# Patient Record
Sex: Female | Born: 1955
Health system: Western US, Academic
[De-identification: ages and names within clinical notes are randomized; demographics above are authoritative.]

---

## 2009-03-04 IMAGING — CR DG ELBOW COMPLETE 3+V*L*
4 series · 4 of 4 positions shown · non-contrast
Comparison: None

CLINICAL DATA: Posterior lateral elbow pain and swelling after
falling 1 week ago.

LEFT ELBOW - COMPLETE 3+ VIEW

[x elbow joint ap left]
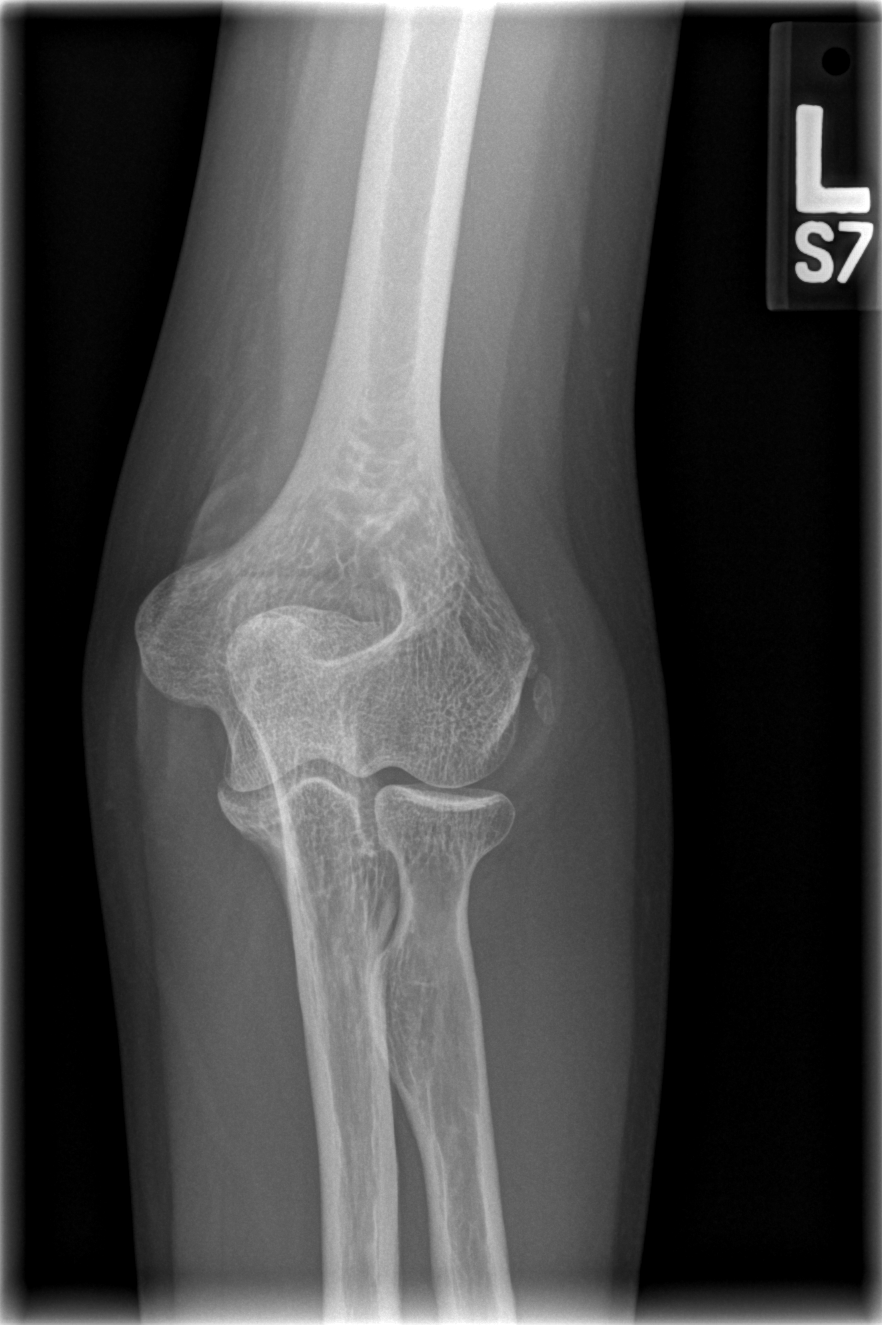

[x elbow joint obl. left (1 of 2)]
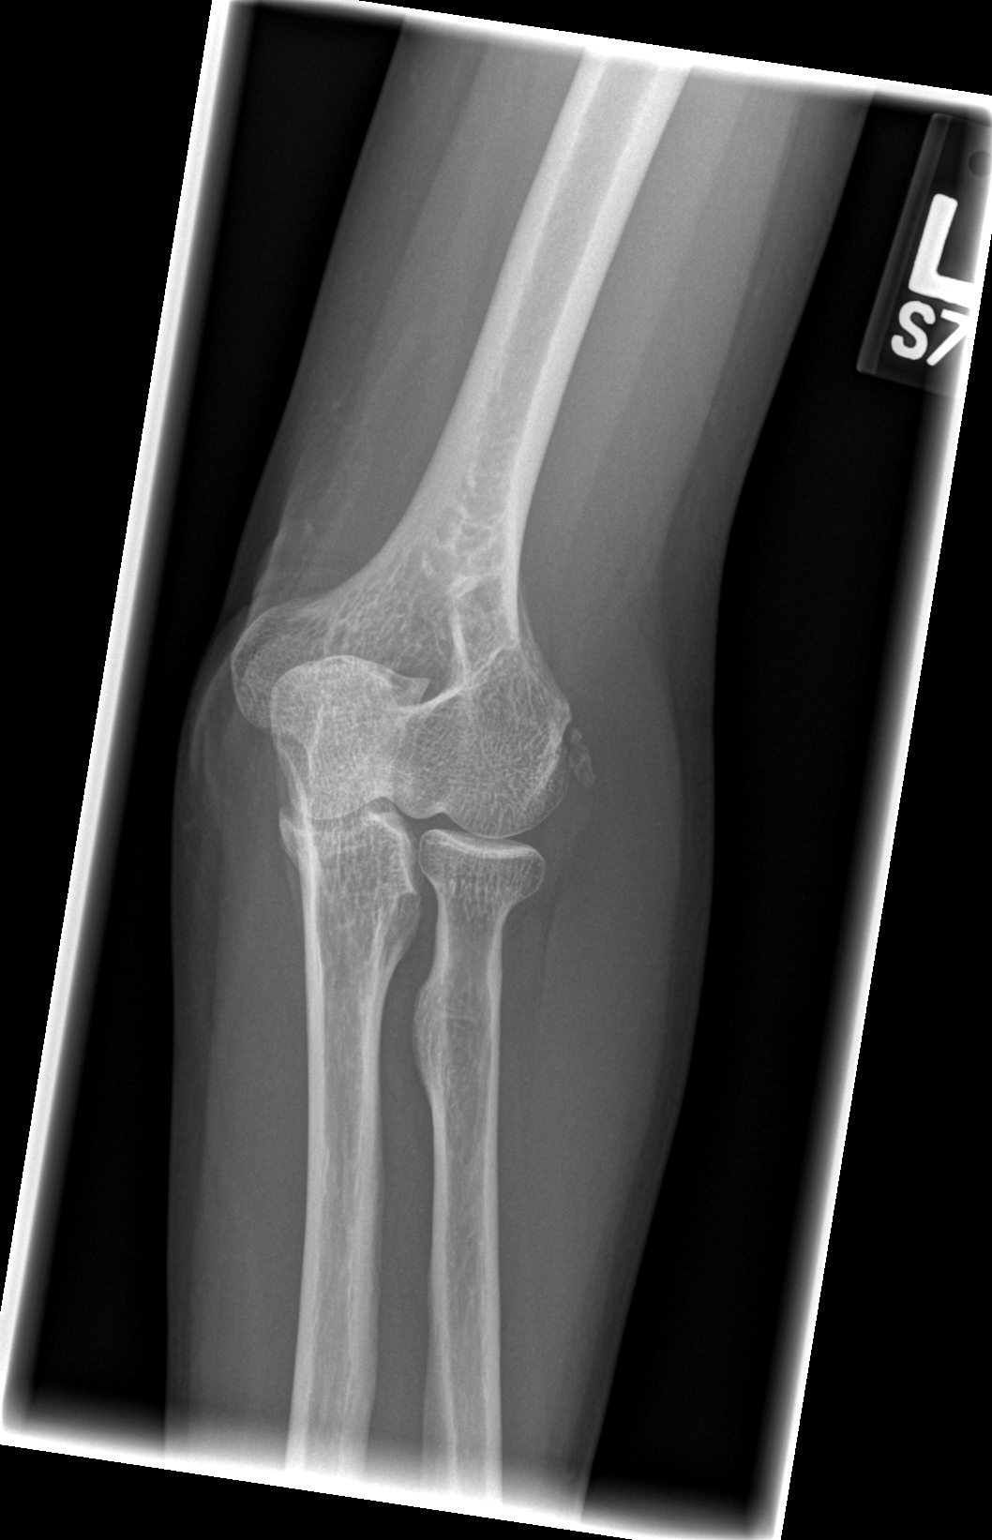

[x elbow joint obl. left (2 of 2)]
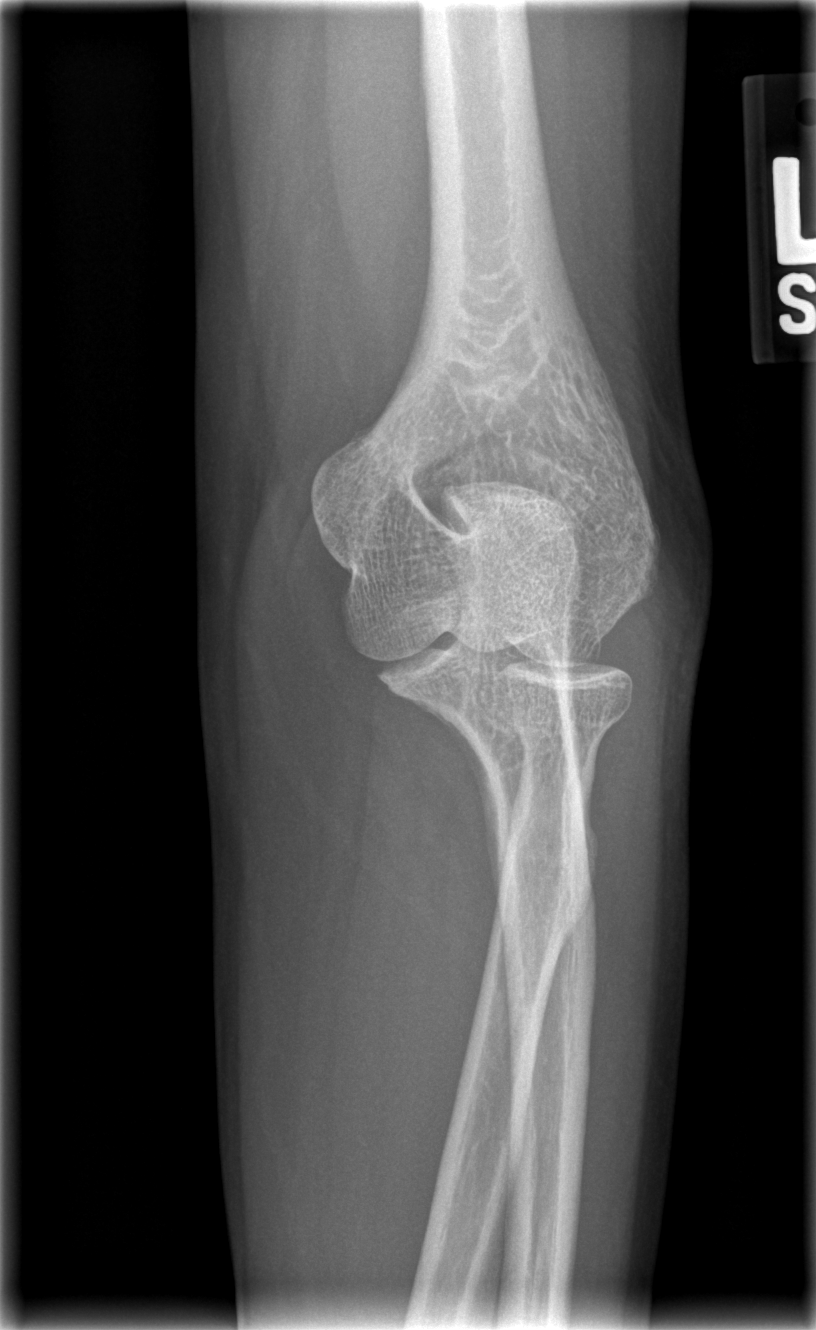

[x elbow joint lat left]
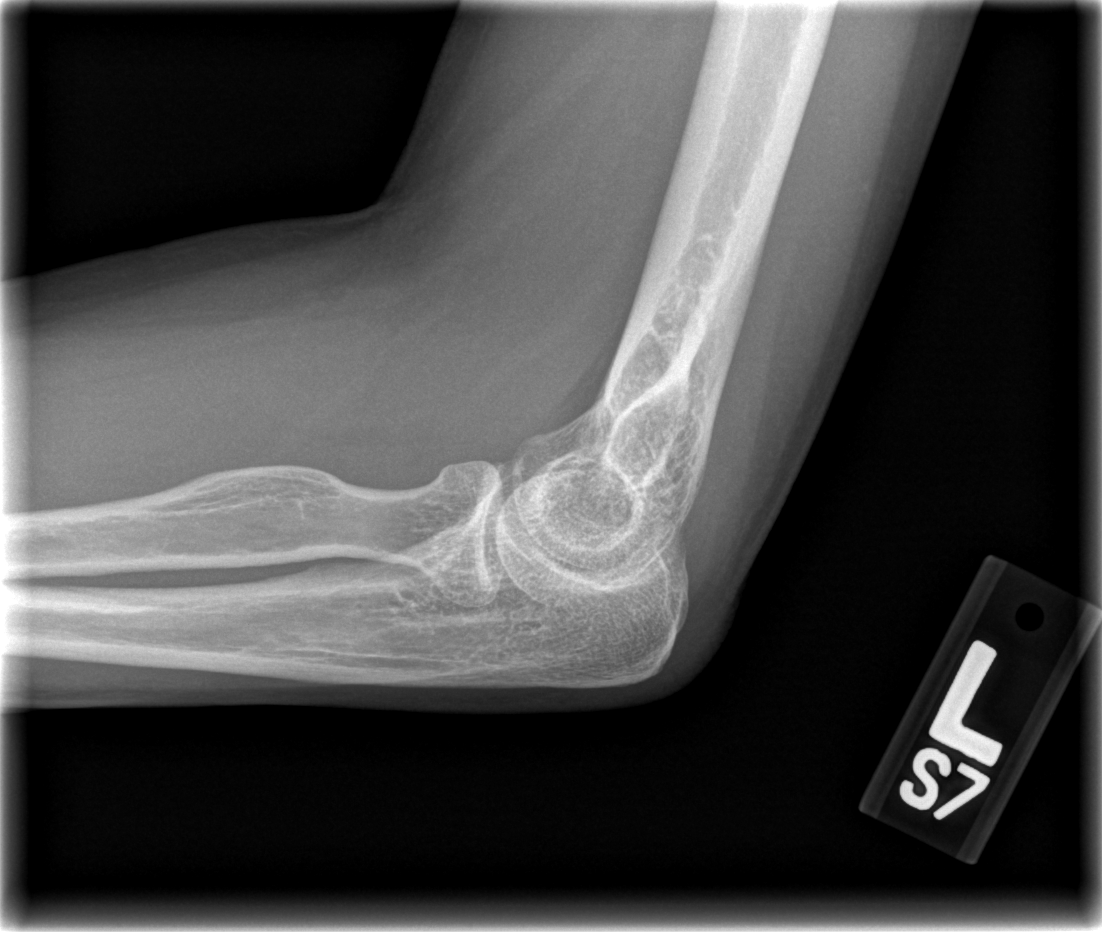

[4 of 4 positions shown; findings below may reference images not displayed]

FINDINGS: There is no significant elbow joint effusion.  Well
corticated ossific densities are noted adjacent to the lateral
humeral epicondyle.  These have an appearance most compatible with
chronic enthesopathic change or an old injury at the common
extensor tendon origin.  No acute avulsion fracture is identified.
The radial head is normally located.
IMPRESSION: 1.  No demonstrated acute osseous findings.  No evidence of elbow
joint effusion.
2.  Ossific density adjacent to the lateral humeral epicondyle
appears nonacute, but may be related to chronic lateral
epicondylitis or an old avulsion injury.  Correlate clinically.

## 2016-09-26 ENCOUNTER — Ambulatory Visit: Payer: MEDICARE

## 2016-09-26 DIAGNOSIS — R079 Chest pain, unspecified: Secondary | ICD-10-CM

## 2016-09-26 DIAGNOSIS — R51 Headache: Secondary | ICD-10-CM

## 2016-09-26 DIAGNOSIS — I10 Essential (primary) hypertension: Secondary | ICD-10-CM

## 2016-09-27 ENCOUNTER — Inpatient Hospital Stay
Admit: 2016-09-27 | Discharge: 2016-09-27 | Disposition: A | Discharge status: Other Acute Inpatient Hospital | Payer: MEDICARE | Source: Home / Self Care

## 2016-12-27 DIAGNOSIS — K130 Diseases of lips: Secondary | ICD-10-CM

## 2016-12-28 ENCOUNTER — Inpatient Hospital Stay
Admit: 2016-12-28 | Discharge: 2016-12-28 | Disposition: A | Discharge status: Home / Self Care | Payer: PRIVATE HEALTH INSURANCE | Source: Home / Self Care

## 2016-12-28 MED ORDER — CLINDAMYCIN HCL 300 MG PO CAPS
300 mg | ORAL_CAPSULE | Freq: Three times a day (TID) | ORAL | 0 refills | Status: AC
Start: 2016-12-28 — End: ?

## 2018-03-05 ENCOUNTER — Ambulatory Visit

## 2018-03-05 ENCOUNTER — Inpatient Hospital Stay
Admit: 2018-03-05 | Discharge: 2018-03-05 | Disposition: A | Discharge status: Home / Self Care | Source: Home / Self Care

## 2018-03-05 DIAGNOSIS — M25552 Pain in left hip: Secondary | ICD-10-CM

## 2018-03-05 MED ORDER — CYCLOBENZAPRINE HCL 10 MG PO TABS
10 mg | ORAL_TABLET | Freq: Two times a day (BID) | ORAL | 0 refills | Status: AC | PRN
Start: 2018-03-05 — End: ?

## 2018-03-05 NOTE — ED Notes
Patient arrived from home c/o gradual onset of worsening L-hip pain s/p ground level fall twice within the last 3 weeks. Patient states the initial fall was a mechanical trip & fall & states landed on buttocks.   Patient denies head injury/LOC.   No obvious signs of deformity noted.   Patient denies numbness/tingling to LLE.   Patient denies loss of bladder/bowel control.   Patient states was seen by PCP last Thursday & given Rx for Ibuprofen, however, reports has not been effective.

## 2018-03-05 NOTE — ED Notes
After care instructions discussed at bedside.   Advised patient to follow up w/ PCP w/in next 2 days.   Discussed w/ pt. S&S to observe for & when to seek emergent care.   Patient verbalized understanding of DC instructions, however, reports is unhappy w/ MD care.   Patient ambulatory w/ a steady gait w/ proper use of crutches noted.

## 2018-03-05 NOTE — ED Provider Notes
[facility]  Emergency Department Service Report    Karen Porter 62 y.o. female , presents with Hip Pain      Triage   Arrived on 03/05/2018 at 1:38 PM   Arrived by Walk-in [14]    ED Triage Vitals   Temp Temp Source BP Heart Rate Resp SpO2 O2 Device Pain Score Weight   03/05/18 1344 03/05/18 1344 03/05/18 1344 03/05/18 1344 03/05/18 1344 03/05/18 1344 03/05/18 1344 03/05/18 1357 03/05/18 1357   36.6 ???C (97.8 ???F) Oral 142/67 98 18 98 % None (Room air) Ten 52.2 kg (115 lb)       Pre hospital care:       Allergies   Allergen Reactions   ??? Penicillins Rash       History   Patient is a 62 y.o. female with hx of HTN who presents to the ED with complaint of gradual onset left hip pain that started about 3 weeks ago after falling twice. Sx are mild, constant, unchanging, non-radiating, no alleviating factors, worse with movement. Denies focal weakness or numbness. Denies recent LOC.           The history is provided by the patient. No language interpreter was used.   Hip Pain   This is a new problem. The current episode started 1 to 4 weeks ago. The problem occurs constantly. The problem has been unchanged. Associated symptoms include arthralgias. Pertinent negatives include no numbness or weakness. The symptoms are aggravated by walking. She has tried nothing for the symptoms. The treatment provided no relief.            Past Medical History:   Diagnosis Date   ??? Amblyopia of right eye    ??? Anxiety    ??? Depression    ??? Hypertension     pt states she stopped taking bp medication 11/25 because it was under conrol with diet   ??? Insomnia    ??? Retinal detachment, right         Past Surgical History:   Procedure Laterality Date   ??? BUNIONECTOMY     ??? CATARACT EXTRACTION  1986    right eye   ??? OVARIAN CYST REMOVAL          Past Family History   Family history reviewed by me and there is no pertinent past family history related to patient's current case and/or care.      Past Social History she reports that she has quit smoking. She smoked 0.25 packs per day. She has never used smokeless tobacco. She reports that she does not drink alcohol or use drugs. Her sexual activity history is not on file.     Review of Systems   Musculoskeletal: Positive for arthralgias.   Neurological: Negative for weakness and numbness.   All other systems reviewed and are negative.      Physical Exam   Physical Exam   Constitutional: She appears well-developed and well-nourished. No distress.   HENT:   Head: Normocephalic and atraumatic.   Eyes: Conjunctivae and EOM are normal.   Neck: Normal range of motion. Neck supple.   Cardiovascular: Normal rate and regular rhythm.    Pulmonary/Chest: Effort normal and breath sounds normal. No respiratory distress.   Abdominal: Soft. There is no tenderness. There is no rebound and no guarding.   Musculoskeletal: Normal range of motion.   Mild tenderness to left hip.    Lymphadenopathy:     She has no cervical adenopathy.  Neurological: She is alert. No sensory deficit.   Moving all four extremities.   Skin: Skin is warm and dry.   Psychiatric: Her behavior is normal.   Nursing note and vitals reviewed.      ED Course          Laboratory Results   Labs Reviewed - No data to display    Imaging Results     No orders to display       Administered Medications     Medication Administration from 03/05/2018 1339 to 03/08/2018 1115     None          Procedures   Procedures    MDM  ED Course:  Nursing note reviewed.  Previous medical records were obtained and reviewed by myself. They reveal a history of HTN amongst others.      1401: I visited the patient to obtain history and perform the physical exam.  Orders placed for XR hip ap+lat left with pelvis (3 views), and XR lumbar spine ap+lat (2 views).      Radiology Results (as interpreted by me):   XR hip ap+lat left with pelvis (3 views):   No acute fracture or dislocation.     XR lumbar spine ap+lat (2 views): No acute fracture or dislocation noted.     1415: On reassessment, the patient remains grossly hemodynamically stable.    Medical Decision Making:  Karen Porter presents to the ED today with chief complaint of left hip pain.  On exam, the patient exhibits mild left hip tenderness.  Differential diagnosis includes acute left hip pain, hip fracture, hip dislocation, amongst others.  Patient with acute left hip pain post fall; xrays ordered and reviewed and show no e/o acute fracture or dislocation; Patient is otherwise well appearing; feel it is reasonable to discharge the patient home at this time with close outpatient follow up given and strict return precautions provided.     Given the patient's physical exam,  and imaging studies, my impression is the patient is experiencing an episode of acute left hip pain. Therefore, I feel comfortable discharging the patient home with close outpatient follow up.    Discharge Instructions:  I advised the patient to follow up with their primary care physician on an outpatient basis over the next 24-48 hours, and recommended prompt  return to the emergency department if they have additional concerns or note worsening symptoms.  The patient indicates understanding of these issues and agrees with the plan.  A copy of the ED  workup results was provided to the patient as well.      Clinical Impression     1. Acute hip pain, left        Prescriptions     Discharge Medication List as of 03/05/2018  2:42 PM      START taking these medications    Details   cyclobenzaprine 10 mg tablet Take 1 tablet (10 mg total) by mouth two (2) times daily as needed for Muscle spasms., Starting Wed 03/05/2018, Print             Disposition and Follow-up   Disposition: Discharge [1]    No future appointments.    Follow up with:  Carroll Kinds, MD  [address]  [address]  [place] [address]  [phone]    In 2 days        Return precautions are specified on After Visit Summary. The documentation on this chart was performed  by Debarah Crape, scribed for Dewayne Shorter., MD    03/05/2018 2:53 PM     All scribe entries and documentation made by the scribe were entered at my direction.  I have reviewed this medical record and agree to the accuracy and completeness of the content entered by the scribe.  The documentation recorded by the scribe accurately reflects the service I personally performed and the decisions made by me.    Karen Porter 11:15 AM                   Dewayne Shorter., MD  03/08/18 1115

## 2019-01-22 ENCOUNTER — Ambulatory Visit: Payer: MEDICARE

## 2019-01-22 ENCOUNTER — Inpatient Hospital Stay
Admit: 2019-01-22 | Discharge: 2019-01-22 | Disposition: A | Discharge status: Home / Self Care | Payer: PRIVATE HEALTH INSURANCE | Source: Home / Self Care

## 2019-01-22 DIAGNOSIS — R079 Chest pain, unspecified: Secondary | ICD-10-CM

## 2019-01-22 DIAGNOSIS — F3170 Bipolar disorder, currently in remission, most recent episode unspecified: Secondary | ICD-10-CM

## 2019-01-22 DIAGNOSIS — H8110 Benign paroxysmal vertigo, unspecified ear: Secondary | ICD-10-CM

## 2019-01-22 DIAGNOSIS — F1911 Other psychoactive substance abuse, in remission: Secondary | ICD-10-CM

## 2019-01-22 LAB — Extra Lavender Top

## 2019-01-22 LAB — Differential Automated: ABSOLUTE LYMPHOCYTE COUNT: 1.18 10*3/uL — ABNORMAL LOW (ref 1.30–3.40)

## 2019-01-22 LAB — Basic Metabolic Panel: TOTAL CO2: 25 mmol/L (ref 20–30)

## 2019-01-22 LAB — UA,Dipstick: NITRITE: NEGATIVE (ref 5.0–8.0)

## 2019-01-22 LAB — CBC: MEAN CORPUSCULAR HEMOGLOBIN: 27.1 pg (ref 26.4–33.4)

## 2019-01-22 LAB — CK, Total: CREATINE PHOSPHOKINASE, TOTAL: 75 U/L (ref 38–282)

## 2019-01-22 LAB — UA,Microscopic: WBCS HPF: 1 {cells}/[HPF] (ref 0–4)

## 2019-01-22 LAB — Lipase: LIPASE: 30 U/L (ref 9–63)

## 2019-01-22 LAB — Hepatic Funct Panel: ASPARTATE AMINOTRANSFERASE: 11 U/L — ABNORMAL LOW (ref 13–47)

## 2019-01-22 LAB — Prothrombin Time Panel: INR: 1 s (ref 11.5–14.4)

## 2019-01-22 LAB — APTT: APTT: 32.1 s (ref 24.4–36.2)

## 2019-01-22 LAB — Troponin I: TROPONIN INTERPRETATION: NEGATIVE ng/mL (ref <0.1)

## 2019-01-22 LAB — Extra Gold Top

## 2019-01-22 MED ORDER — MECLIZINE HCL 25 MG PO TABS
25 mg | ORAL_TABLET | Freq: Three times a day (TID) | ORAL | 0 refills | Status: AC | PRN
Start: 2019-01-22 — End: ?

## 2019-01-22 MED ADMIN — ONDANSETRON HCL 4 MG/2ML IJ SOLN: 4 mg | INTRAVENOUS | @ 18:00:00 | Stop: 2019-01-22 | NDC 60505613005

## 2019-01-22 MED ADMIN — SODIUM CHLORIDE 0.9 % IV BOLUS: 500 mL | INTRAVENOUS | @ 18:00:00 | Stop: 2019-01-22 | NDC 00338954304

## 2019-01-22 MED ADMIN — MECLIZINE HCL 25 MG PO TABS: 50 mg | ORAL | @ 20:00:00 | Stop: 2019-01-22 | NDC 50268052315

## 2019-01-22 MED ADMIN — IOHEXOL 350 MG/ML IV SOLN: 150 mL | INTRAVENOUS | @ 19:00:00 | Stop: 2019-01-22 | NDC 00407141493

## 2019-01-22 MED ADMIN — HYDROCODONE-ACETAMINOPHEN 5-325 MG PO TABS: 1 | ORAL | @ 22:00:00 | Stop: 2019-01-22 | NDC 00406012362

## 2019-01-22 NOTE — Consults
PATIENTMickayla Porter  MRN:  [id number]  DOB:  10/04/1955  DATE OF SERVICE:  01/22/2019    REQUESTING PHYSICIAN: No ref. provider found  PRIMARY CARE PHYSICIAN: Carroll Kinds, MD    History of Present Illness:    Karen Porter is a right-handed 63 y.o. female who  has a past medical history of Amblyopia of right eye, Anxiety, Depression, Hypertension, Insomnia, and Retinal detachment, right. who was admitted for dizziness, consulted by neurology in setting of code stroke.     Patient an otherwise fairly health female presenting with dizziness. Reportedly listening to music, sitting, noticed acute onset of dizziness with associated HA (which happens from time to time). Attempted to stand, dizziness continued but did not worsen. Described this as a 'buzzing sensation', not necessarily vertigo, but not able to describe further. Patient reports that she may have had mild 'fuzziness to my vision', but no overt change. Is blind in R eye. No weakness, los of sensation or strength, slurred speech, CP, SOB, fevers. No HX of stroke in herself or family.     Only takes a medication for HTN and vitamin supplements. Denies toxic habits although has been admitted for polysubstance abuse in the past and has been to rehab.       Past Medical History:   Diagnosis Date   ??? Amblyopia of right eye    ??? Anxiety    ??? Depression    ??? Hypertension     pt states she stopped taking bp medication 11/25 because it was under conrol with diet   ??? Insomnia    ??? Retinal detachment, right        Past Surgical History:   Procedure Laterality Date   ??? BUNIONECTOMY     ??? CATARACT EXTRACTION  1986    right eye   ??? OVARIAN CYST REMOVAL         Allergy:  Allergies   Allergen Reactions   ??? Penicillins Rash       Medications:  Prior to Admission medications    Medication Sig Start Date End Date Taking? Authorizing Provider   BusPIRone HCl (BUSPAR PO) Take 10 mg by mouth three (3) times daily .    [provider] cyclobenzaprine 10 mg tablet Take 1 tablet (10 mg total) by mouth two (2) times daily as needed for Muscle spasms. 03/05/18   Pablo Ledger, MD   Divalproex Sodium (DEPAKOTE PO) Take 1,000 mg by mouth daily.    [provider]   lisinopril 5 mg tablet Take 5 mg by mouth daily.    [provider]   olanzapine 10 mg tablet Take 5 mg by mouth at bedtime as needed.    [provider]       Family History:  No family history on file.    Social History:  Social History     Socioeconomic History   ??? Marital status: Divorced     Spouse name: Not on file   ??? Number of children: Not on file   ??? Years of education: Not on file   ??? Highest education level: Not on file   Occupational History     Employer: NA   Social Needs   ??? Financial resource strain: Not on file   ??? Food insecurity:     Worry: Not on file     Inability: Not on file   ??? Transportation needs:     Medical: Not on file     Non-medical: Not  on file   Tobacco Use   ??? Smoking status: Former Smoker     Packs/day: 0.25   ??? Smokeless tobacco: Never Used   ??? Tobacco comment: 1-3 cigs a day   Substance and Sexual Activity   ??? Alcohol use: No   ??? Drug use: No   ??? Sexual activity: Not on file     Comment: last used last year.    Lifestyle   ??? Physical activity:     Days per week: Not on file     Minutes per session: Not on file   ??? Stress: Not on file   Relationships   ??? Social connections:     Talks on phone: Not on file     Gets together: Not on file     Attends religious service: Not on file     Active member of club or organization: Not on file     Attends meetings of clubs or organizations: Not on file     Relationship status: Not on file   Other Topics Concern   ??? 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 Vitals signs for last 24 hours: Temp:  [36.6 ???C (97.9 ???F)] 36.6 ???C (97.9 ???F)  Heart Rate:  [91-96] 92  Resp:  [11-18] 11  BP: (147-159)/(78-88) 157/85  SpO2:  [97 %-99 %] 99 %   General: well developed, well nourished. alert, appears stated age and cooperative  Extremities: No clubbing, cyanosis or edema.  Pulses are intact in the extremities. There is no swelling of the vessels.    Neurologic exam:   Mental status: Attention and concentration are normal.  Alert and oriented to person, place and time.  Speech is spontaneous and fluent with naming, repetition and comprehension intact. Fund of knowledge is intact.   Cranial nerves: Visual fields are full.  Fundi are normal with no edema of optic discs.  Pupils are equal, round and reactive to light.  Extraocular movements intact.  Face intact to light touch and pinprick.  Face is symmetric. Hearing intact to finger rub. Palate elevates equally.  Sternocleidomastoid 5/5.  Tongue midline.  Motor: Normal bulk and tone.  Strength is 5/5 throughout.   Sensory: Intact to light touch, vibration, proprioception, pain.  Reflexes: 2+ and symmetric  Coordination: Intact to fine finger movements.   Gait: Deferred    Lab Review:      Recent labs:   Recent Results (from the past 72 hour(s))   Basic Metabolic Panel    Collection Time: 01/22/19 10:57 AM   Result Value Ref Range    Sodium 140 135 - 146 mmol/L    Potassium 3.5 (L) 3.6 - 5.3 mmol/L    Chloride 101 96 - 106 mmol/L    Total CO2 25 20 - 30 mmol/L    Anion Gap 14 8 - 19 mmol/L    Glucose 147 (H) 65 - 99 mg/dL    GFR Estimate for Non-African American 85 See GFR Additional Information mL/min/1.50m2    GFR Estimate for African American >89 See GFR Additional Information mL/min/1.73m2    GFR Additional Information See Comment     Creatinine 0.75 0.60 - 1.30 mg/dL    Urea Nitrogen 13 7 - 22 mg/dL    Calcium 9.2 8.6 - 95.6 mg/dL  Troponin I    Collection Time: 01/22/19 10:57 AM   Result Value Ref Range Troponin I <0.04 <0.1 ng/mL    Troponin Interpretation Negative Negative   CK, Total    Collection Time: 01/22/19 10:57 AM   Result Value Ref Range    Creatine Phosphokinase, Total 75 38 - 282 U/L   Prothrombin Time Panel    Collection Time: 01/22/19 10:57 AM   Result Value Ref Range    Prothrombin Time 12.5 11.5 - 14.4 seconds    INR 1.0 .   APTT    Collection Time: 01/22/19 10:57 AM   Result Value Ref Range    APTT 32.1 24.4 - 36.2 seconds   CBC    Collection Time: 01/22/19 10:57 AM   Result Value Ref Range    White Blood Cell Count 7.03 4.16 - 9.95 x10E3/uL    Red Blood Cell Count 4.28 3.96 - 5.09 x10E6/uL    Hemoglobin 11.6 11.6 - 15.2 g/dL    Hematocrit 08.6 57.8 - 45.2 %    Mean Corpuscular Volume 84.1 79.3 - 98.6 fL    Mean Corpuscular Hemoglobin 27.1 26.4 - 33.4 pg    MCH Concentration 32.2 31.5 - 35.5 g/dL    Red Cell Distribution Width-SD 42.0 36.9 - 48.3 fL    Red Cell Distribution Width-CV 13.7 11.1 - 15.5 %    Platelet Count, Auto 385 143 - 398 x10E3/uL    Mean Platelet Volume 8.7 (L) 9.3 - 13.0 fL    Nucleated RBC%, automated 0.0 No Ref. Range %    Absolute Nucleated RBC Count 0.00 0.00 - 0.00 x10E3/uL    Neutrophil Abs (Prelim) 5.18 See Absolute Neut Ct. x10E3/uL   Differential, Automated    Collection Time: 01/22/19 10:57 AM   Result Value Ref Range    Neutrophil Percent, Auto 73.6 No Ref. Range %    Lymphocyte Percent, Auto 16.8 No Ref. Range %    Monocyte Percent, Auto 5.7 No Ref. Range %    Eosinophil Percent, Auto 2.7 No Ref. Range %    Basophil Percent, Auto 0.6 No Ref. Range %    Immature Granulocytes% 0.6 No Reference Range %    Absolute Neut Count 5.18 1.80 - 6.90 x10E3/uL    Absolute Lymphocyte Count 1.18 (L) 1.30 - 3.40 x10E3/uL    Absolute Mono Count 0.40 0.20 - 0.80 x10E3/uL    Absolute Eos Count 0.19 0.00 - 0.50 x10E3/uL    Absolute Baso Count 0.04 0.00 - 0.10 x10E3/uL    Absolute Immature Gran Count 0.04 0.00 - 0.04 x10E3/uL   Extra Lavender Top    Collection Time: 01/22/19 10:57 AM Result Value Ref Range    Extra Tube Performed    Extra Gold Top    Collection Time: 01/22/19 10:57 AM   Result Value Ref Range    Extra Tube Performed    Lipase    Collection Time: 01/22/19 10:57 AM   Result Value Ref Range    Lipase 30 9 - 63 U/L   Hepatic Funct Panel    Collection Time: 01/22/19 10:57 AM   Result Value Ref Range    Total Protein 6.9 6.1 - 8.2 g/dL    Albumin 3.9 3.9 - 5.0 g/dL    Bilirubin,Total 0.3 0.1 - 1.2 mg/dL    Bilirubin,Conjugated <0.2 <=0.3 mg/dL    Alkaline Phosphatase 90 37 - 113 U/L    Aspartate Aminotransferase 11 (L) 13 - 47 U/L    Alanine Aminotransferase 8 8 -  64 U/L   UA,Dipstick    Collection Time: 01/22/19 11:51 AM   Result Value Ref Range    Urine Color Colorless      Specific Gravity 1.040 (H) 1.005 - 1.030    pH,Urine 7.0 5.0 - 8.0    Blood Negative Negative    Bilirubin Negative Negative    Ketones Negative Negative    Glucose Negative Negative    Protein Negative Negative    Leukocyte Esterase Negative Negative    Nitrite Negative Negative   UA,Microscopic    Collection Time: 01/22/19 11:51 AM   Result Value Ref Range    RBC per uL 1 0 - 11 cells/uL    WBC per uL 4 0 - 22 cells/uL    RBC per HPF 0 0 - 2 cells/HPF    WBC per HPF 1 0 - 4 cells/HPF    Squamous Epi Cells 6 0 - 17 cells/uL       Neurologic Data:  IMPRESSION:   ???  CT BRAIN:  No evidence of acute intracranial hemorrhage or cortical infarction.  ???  CT PERFUSION: No perfusion asymmetry.  ???  CTA BRAIN:  No evidence of stenosis or large vessel occlusion.  ???  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     CTA NECK: No significant focal stenosis or dissection.   ???  Mild centrilobular emphysema with a few scattered bilateral pulmonary micronodules in the visualized lung apices. May consider nonemergent follow-up chest CT if there are high clinical risk factor such as a smoking history.  ???    Assessment:   Karen Porter is a right-handed 63 y.o. female who  has a past medical history of Amblyopia of right eye, Anxiety, Depression, Hypertension, Insomnia, and Retinal detachment, right. who presents with dizziness. Patient's symptoms are poorly described, but not clearly consistent with vertigo, disequilibrium, nystagmus. Neuro exam benign, CTH without intracranial pathology. Query psychiatric component, hypovolemia. Altogether low concern for vestibular lesion, no e/o stroke. No further neurologic work up deemed at this time.     Plan/ Recommendation:   -no further WU for stroke warranted  -continue WU for dizziness, as dictated by primary team. 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 Radiology will reach out to patient to discuss monitoring and management moving forward  -neuro will sign off at this time    Thank you for this interesting consult.      Author:  Darcus Austin. Norma Ignasiak, MD 01/22/2019 12:44 PM       I personally interviewed and examined the patient. I reviewed the findings with the resident physician. I confirmed the key elements of the history and physical exam and study results.  I agree with the resident physician regarding the assessment and plan. The patient's condition had inherent instability and potential for instability The patient was seen and was unstable and 90 minutes of floor time was personally spent by me. I have seen and examined the patient today in multiple 10 -15 minute interval segments (or longer) during a 24 hour period in the hospital.  I have discussed the care with the family at bedside for this patient given the patient's cognitive condition  counseling the patient on diagnosis, imaging finding results, differential diagnosis, reviewing outside  medical records, and about the prognosis, and management options.. I have coordinated care with multiple teams, and reviewed the medical records, consult notes, vital sign flow sheets, cardiac monitoring, brain imaging, EEGs, and serial sequential labs.  MEDICAL DECISION MAKING: this is a highly complex case given the illness of multiple organ systems that affect the nervous system and create a risk of stroke or neurological injury. See resident???s note for further details.

## 2019-01-22 NOTE — ED Notes
RN assisted patient ambulate to restroom, pt needed plus 1 assist r/t dizziness

## 2019-01-22 NOTE — ED Provider Notes
[facility]  Emergency Department Service Report    Karen Porter 63 y.o. female , presents with Dizziness      Triage   Arrived on 01/22/2019 at 10:30 AM   Arrived by Walk-in [14]    ED Triage Vitals   Temp Temp Source BP Heart Rate Resp SpO2 O2 Device Pain Score Weight   01/22/19 1038 01/22/19 1038 01/22/19 1038 01/22/19 1038 01/22/19 1038 01/22/19 1038 01/22/19 1038 01/22/19 1100 --   36.6 ???C (97.9 ???F) Oral 159/88 96 18 98 % None (Room air) Zero        Pre hospital care:       Allergies   Allergen Reactions   ??? Penicillins Rash       History   Patient is a 63 y.o. female with hx of HTN who presents to the ED with complaint of sudden onset of dizziness that began last night at 8:30pm. Dizziness further described as room-spinning and is constant and unchanged. There are no alleviating factors noted. Feels off balance. Denies chest pain or SOB prior to onset of sx. Pt feels nauseous and states she had 1 episode of non-bloody/non-bilious vomiting this am. Denies focal weakness/numbness of the extremities. Denies vision changes. Denies fever, chills. Pt states over the past few days she has had some intermittent left lower chest/LUQ abdominal pain. Denies cough, SOB. Denies diarrhea.     The history is provided by the patient. No language interpreter was used.   Neurologic Problem   The primary symptoms include dizziness, nausea and vomiting. Primary symptoms do not include visual change, focal weakness, speech change or fever. The symptoms began yesterday. The symptoms are unchanged.   She describes the dizziness as a sensation of spinning. The dizziness began yesterday. The dizziness has been unchanged since its onset. Dizziness is a new problem. Dizziness also occurs with nausea and vomiting.            Past Medical History:   Diagnosis Date   ??? Amblyopia of right eye    ??? Anxiety    ??? Depression    ??? Hypertension     pt states she stopped taking bp medication 11/25 because it was under conrol with diet   ??? Insomnia    ??? Retinal detachment, right         Past Surgical History:   Procedure Laterality Date   ??? BUNIONECTOMY     ??? CATARACT EXTRACTION  1986    right eye   ??? OVARIAN CYST REMOVAL          Past Family History   Family history reviewed by me and there is no pertinent past family history related to the patient's current case and/or care.             Past Social History   she reports that she has quit smoking. She smoked 0.25 packs per day. She has never used smokeless tobacco. She reports that she does not drink alcohol or use drugs. No history on file for sexual activity.     Review of Systems   Constitutional: Negative for chills and fever.   Eyes: Negative for visual disturbance.   Respiratory: Negative for cough and shortness of breath.    Gastrointestinal: Positive for abdominal pain, nausea and vomiting. Negative for diarrhea.   Neurological: Positive for dizziness. Negative for speech change, focal weakness, speech difficulty and numbness.   All other systems reviewed and are negative.      Physical  Exam   Physical Exam  Vitals signs and nursing note reviewed.   Constitutional:       Appearance: She is well-developed.   HENT:      Head: Normocephalic and atraumatic.   Eyes:      Pupils: Pupils are equal, round, and reactive to light.   Neck:      Musculoskeletal: Neck supple.   Cardiovascular:      Heart sounds: Normal heart sounds.   Pulmonary:      Breath sounds: Normal breath sounds.   Abdominal:      General: Bowel sounds are normal.      Palpations: Abdomen is soft.      Tenderness: There is abdominal tenderness in the left upper quadrant.   Musculoskeletal: Normal range of motion.   Skin:     General: Skin is dry.   Neurological:      Mental Status: She is alert and oriented to person, place, and time.      Cranial Nerves: Cranial nerves are intact.      Sensory: No sensory deficit.      Motor: Motor function is intact.      Comments: Negative pronator drift   Psychiatric: Mood and Affect: Mood normal.         Behavior: Behavior is cooperative.         ED Course          Laboratory Results     Labs Reviewed   BASIC METABOLIC PANEL - Abnormal; Notable for the following components:       Result Value    Potassium 3.5 (*)     Glucose 147 (*)     All other components within normal limits   CBC (PERFORMABLE) - Abnormal; Notable for the following components:    Mean Platelet Volume 8.7 (*)     All other components within normal limits   DIFFERENTIAL, AUTOMATED (PERFORMABLE) - Abnormal; Notable for the following components:    Absolute Lymphocyte Count 1.18 (*)     All other components within normal limits   UA,DIPSTICK - Abnormal; Notable for the following components:    Specific Gravity 1.040 (*)     All other components within normal limits   HEPATIC FUNCT PANEL - Abnormal; Notable for the following components:    Aspartate Aminotransferase 11 (*)     All other components within normal limits   CK, TOTAL - Normal   PROTHROMBIN TIME PANEL - Normal   APTT - Normal   UA,MICROSCOPIC - Normal   LIPASE - Normal   CBC & AUTO DIFFERENTIAL    Narrative:     The following orders were created for panel order CBC & Plt & Diff.  Procedure                               Abnormality         Status                     ---------                               -----------         ------                     JYN[[id number]]  Abnormal            Final result               Differential, Automated[[id number]]      Abnormal            Final result                 Please view results for these tests on the individual orders.   TROPONIN I   RAINBOW DRAW TO LABORATORY    Narrative:     The following orders were created for panel order Brigid Re (ED Adult Blood Draw: Nurse Protocol).  Procedure                               Abnormality         Status                     ---------                               -----------         ------ Extra Tresa Res ZOX[[id number]]                               Final result               Extra Gold WJX[[id number]]                                   Final result                 Please view results for these tests on the individual orders.   EXTRA LAVENDER TOP   EXTRA GOLD TOP   URINALYSIS W/REFLEX TO CULTURE    Narrative:     The following orders were created for panel order Urinalysis w/Reflex to Culture.  Procedure                               Abnormality         Status                     ---------                               -----------         ------                     UA,Dipstick[[id number]]                  Abnormal            Final result               UA,Microscopic[[id number]]               Normal              Final result                 Please view results for these tests on the individual orders.       Imaging Results  CT brain stroke wo+w contrast less than 18 hours   Final Result by Vincent Gros., MD (08/13 1207)   IMPRESSION:       CT BRAIN:   No evidence of acute intracranial hemorrhage or cortical infarction.      CT PERFUSION: No perfusion asymmetry.      CTA BRAIN:   No evidence of stenosis or large vessel occlusion.      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       CTA NECK: No significant focal stenosis or dissection.       Mild centrilobular emphysema with a few scattered bilateral pulmonary micronodules in the visualized lung apices. May consider nonemergent follow-up chest CT if there are high clinical risk factor such as a smoking history.      Valda Lamb, M.D, have reviewed this radiological study personally and I am in full agreement with the findings presented in this report.      Dictated by: Blima Ledger   01/22/2019 11:50 AM      Signed by: Roxy Manns   01/22/2019 12:07 PM          Administered Medications     Medication Administration from 01/22/2019 1030 to 01/22/2019 1350       Date/Time Order Dose Route Action Action by Comments 01/22/2019 1252 sodium chloride 0.9% IV soln bolus 500 mL 0 mL Intravenous Stopped Paulita Cradle, RN      01/22/2019 1107 sodium chloride 0.9% IV soln bolus 500 mL 500 mL Intravenous New Bag/ Syringe/ Cartridge Felisa Bonier, RN      01/22/2019 1108 ondansetron 4 mg/2 mL inj 4 mg 4 mg IV Push Given Felisa Bonier, RN      01/22/2019 1145 iohexol (Omnipaque) 350 mg/mL inj 150 mL 150 mL Intravenous Given Mardirosian, Ani 5cc/sec     01/22/2019 1259 meclizine tab 50 mg 50 mg Oral Given Paulita Cradle, RN           Procedures   ED ECG interpretation  Date/Time: 01/22/2019 10:58 AM  Performed by: Ovidio Kin., DO  Authorized by: Gaetana Michaelis D., DO     ECG reviewed by ED Physician in the absence of a cardiologist: yes    Rate:     ECG rate:  83    ECG rate assessment: normal    Rhythm:     Rhythm: sinus rhythm    ST segments:     ST segments:  Normal  T waves:     T waves: normal        Observation time:  Patient has no family history that is relevant to this complaint.   Patient first seen at 68.  Observation began at 1055 and was necessary to perform lab tests, evaluate imaging studies, ensure response to treatment, consult neurology, and rule out emergent intervention.   Upon re-evaluation, observation revealed that the patient should be discharged.    Patient medically cleared for discharge at 1430.  Total time of observation was greater than 3 hours.     MDM  Number of Diagnoses or Management Options     Amount and/or Complexity of Data Reviewed  Clinical lab tests: ordered and reviewed  Tests in the radiology section of CPT???: ordered and reviewed  Tests in the medicine section of CPT???: ordered and reviewed  Decide to obtain previous medical records or to obtain history from someone other than the patient: yes  Review and summarize past medical records: yes (HTN)  Independent visualization of images, tracings, or specimens: yes Consult: 1058: I spoke with the Neuro Fellow who evaluated the pt at the bedside. Case discussed regarding the patient's presenting symptoms, physical exam findings, and current condition. Recommends CT stroke protocol.  1207: CT brain, CTA brain, CTA neck normal.     Data Reviewed/Counseling: I have reviewed the patient's vital signs, nursing notes and old medical records. I had a detailed discussion with the patient regarding the historical points, exam findings, and any diagnostic results supporting the {admit/discharge:21972} diagnosis. I also discussed {topics:21973}.          Clinical Impression     1. 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 Chest pain at rest    3. History of substance abuse (HCC/RAF)        Prescriptions     New Prescriptions    No medications on file       Disposition and Follow-up   Disposition:     No future appointments.    Follow up with:  No follow-up provider specified.    Return precautions are specified on After Visit Summary.      The documentation on this chart was performed by Avie Arenas, scribed for Gaetana Michaelis D., DO    01/22/2019 10:52 AM     ***

## 2019-01-22 NOTE — ED Notes
Discharge VS updated.   Hl dcd.  This Probation officer reviewed post care: prescription given.  Slow steady gait, pt. Reports her friend will walk with her home.   Pt. is discharged.

## 2019-01-29 ENCOUNTER — Ambulatory Visit: Payer: MEDICARE

## 2019-01-29 DIAGNOSIS — I671 Cerebral aneurysm, nonruptured: Secondary | ICD-10-CM

## 2019-01-29 NOTE — Consults
INTERVENTIONAL NEURORADIOLOGY  INITIAL OUTPATIENT CONSULTATION    PATIENT NAME: Karen Porter     DOB: 06/17/1955     MR#: [id number]     DATE: 01/29/2019     REFERRING PHYSICIAN: Tamsen Meek., MD     REASON FOR CONSULTATION:  This is a 63 y.o. female who was referred for evaluation of anterior comm aneurysm.     HISTORY OF PRESENT ILLNESS:   Karen Porter is a 63 y.o. female who presents with a history of amblyopia of right eye, anxiety, depression, HTN, insomnia, right retinal detachment, and polysubstance abuse, used cocaine and meth (~5 years sober). She recently presented to the ED for dizziness and was worked up for a code stroke (08/13./20). The patient was incidentally found to have a small anterior comm aneurysm measuring 3 mm on CT head. She presents today for further evaluation and possible treatment.     The patient was reportedly listening to music, sitting, noticed acute onset of dizziness with associated headache and blurry vision. She attempted to stand, dizziness continued but did not worsen. Described hearing a 'buzzing noise', with left ear pain. She felt very nauseous and had an episode of emesis. This prompted her to seek medical attention, she was ruled out for a stroke in the ED and was discharged with a diagnosis of vertigo. Subsequently, she saw an ENT at [facility] who diagnosed her with a left ear infection and started her on antibiotics. Denies any recurrent episodes of vertigo. Currently denies weakness, numbness, tingling, or slurred speech. She also denies any history of stroke, seizure or SAH. No family history of cerebral aneurysms.   ???  In regards to her high blood pressure, she takes metoprolol 50 mg daily. Denies checking her blood pressure daily. Reports to follow up with her PCP regularly who manages her anti-hypertensive medication.     PAST MEDICAL HISTORY:   Past Medical History:   Diagnosis Date   ??? Amblyopia of right eye    ??? Anxiety    ??? 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 Depression    ??? Hypertension     pt states she stopped taking bp medication 11/25 because it was under conrol with diet   ??? Insomnia    ??? Retinal detachment, right      PAST SURGICAL HISTORY:   Past Surgical History:   Procedure Laterality Date   ??? BUNIONECTOMY     ??? CATARACT EXTRACTION  1986    right eye   ??? OVARIAN CYST REMOVAL       MEDICATIONS:   Medications that the patient states to be currently taking   Medication Sig   ??? divalproex 500 mg 24 hr tablet TAKE 1 TABLET BY MOUTH TWICE A DAY   ??? Divalproex Sodium (DEPAKOTE PO) Take 1,000 mg by mouth daily.   ??? hydrOXYzine 25 mg tablet TAKE TWO (2) TABLETS BY MOUTH ONCE A DAY, AS NEEDED   ??? levoFLOXacin 500 mg tablet Take 500 mg by mouth daily.   ??? metoprolol succinate 50 mg 24 hr tablet TAKE 1 TABLET BY MOUTH DAILY   ??? olanzapine 10 mg tablet Take 5 mg by mouth at bedtime as needed.       ALLERGIES:   Allergies   Allergen Reactions   ??? Penicillins Rash     SOCIAL HISTORY:   Social History     Socioeconomic History   ??? Marital status: Divorced     Spouse name: Not on  file   ??? Number of children: Not on file   ??? Years of education: Not on file   ??? Highest education level: Not on file   Occupational History     Employer: NA   Social Needs   ??? Financial resource strain: Not on file   ??? Food insecurity:     Worry: Not on file     Inability: Not on file   ??? Transportation needs:     Medical: Not on file     Non-medical: Not on file   Tobacco Use   ??? Smoking status: Former Smoker     Packs/day: 0.25   ??? Smokeless tobacco: Never Used   ??? Tobacco comment: 1-3 cigs a day   Substance and Sexual Activity   ??? Alcohol use: No   ??? Drug use: Yes     Types: Methamphetamines, Cocaine     Comment: 5 years sober    ??? Sexual activity: Not on file     Comment: last used last year.    Lifestyle   ??? Physical activity:     Days per week: Not on file     Minutes per session: Not on file   ??? Stress: Not on file   Relationships   ??? Social connections: Talks on phone: Not on file     Gets together: Not on file     Attends religious service: Not on file     Active member of club or organization: Not on file     Attends meetings of clubs or organizations: Not on file     Relationship status: Not on file   Other Topics Concern   ??? Do you exercise at least a day, 3 or more days a week? Not Asked   ??? Types of Exercise? (List in Comments) Not Asked   ??? Do you follow a special diet? Not Asked   ??? Vegan? Not Asked   ??? Vegetarian? Not Asked   ??? Pescatarian? Not Asked   ??? Lactose Free? Not Asked   ??? Gluten Free? Not Asked   ??? Omnivore? 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 Heart disease Mother    ??? Stroke Maternal Grandfather    ??? Heart disease Maternal Grandfather      REVIEW OF SYSTEMS:  See HPI.  A 14 point review of systems was performed and is otherwise negative.     PHYSICAL EXAMINATION:    Vital Signs:BP 160/82  ~ Pulse 80  ~ Temp 36.2 ???C (97.2 ???F) (Forehead)  ~ Resp 16  ~ Ht 5' 3'' (1.6 m)  ~ Wt 132 lb (59.9 kg)  ~ SpO2 100%  ~ BMI 23.38 kg/m???     GEN:  Well developed, well nourished, in no acute distress.   SKIN:  PWD, no rashes noted.    HEENT:  Normocephalic.  Sclera anicteric.   NECK:  supple without adenopathy. Trachea midline.   EXT:   without clubbing, cyanosis or edema.    NEURO:    Mental Status:   General: Awake, alert and oriented to person, place and time   Concentration and attention span: Normal   Fund of knowledge: Adequate recent and remote recall   Language: Fluent and articulate without evidence of aphasia or dysarthria  Cranial Nerves:  II: PERRL, visual fields full to confrontation   III, IV, VI: Gaze conjugate, EOMi, no nystagmus   V: Sensation intact and symmetric to light touch V1-V3   VII: Symmetric facial motor function bilaterally   VIII: Intact to finger rub bilaterally IX, X: Palate elevates symmetrically    XI: Normal shrug bilaterally   XII: Tongue protrudes midline  The corneal reflexes were not tested  Motor:   5/5 muscle strength against resistance throughout.    Normal bulk and tone.   No fasciculations, myotonia or other abnormal movements.  Sensation:   Light touch: Intact and symmetric in the bilateral upper and lower extremities.  DTRs:    2+ patellar bilaterally  Coordination:   Rapid alternating movements are normal, no dysdiadochokinesia  Gait:   Normal casual gait without ataxia.     Normal arm swing and step size.      Labs:  Lab Results   Component Value Date    NA 140 01/22/2019    K 3.5 (L) 01/22/2019    CL 101 01/22/2019    CO2 25 01/22/2019    GLUCOSE 147 (H) 01/22/2019    CREAT 0.75 01/22/2019    BUN 13 01/22/2019    CALCIUM 9.2 01/22/2019     Lab Results   Component Value Date    ALT 8 01/22/2019    AST 11 (L) 01/22/2019    ALKPHOS 90 01/22/2019    BILITOT 0.3 01/22/2019     Lab Results   Component Value Date    WBC 7.03 01/22/2019    HGB 11.6 01/22/2019    HCT 36.0 01/22/2019    MCV 84.1 01/22/2019    PLT 385 01/22/2019       IMAGING STUDIES:  01/22/2019 CT brain stroke protocol   IMPRESSION:   ???  CT BRAIN:  No evidence of acute intracranial hemorrhage or cortical infarction.  ???  CT PERFUSION: No perfusion asymmetry.  ???  CTA BRAIN:  No evidence of stenosis or large vessel occlusion.  ???  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     CTA NECK: No significant focal stenosis or dissection.   ???  Mild centrilobular emphysema with a few scattered bilateral pulmonary micronodules in the visualized lung apices. May consider nonemergent follow-up chest CT if there are high clinical risk factor such as a smoking history.    ASSESSMENT/DISCUSSION: Karen Porter is a 63 y.o. patient who was referred for evaluation of history of amblyopia of right eye, anxiety, depression, HTN, insomnia, right retinal detachment, and polysubstance abuse (~5 years sober). She recently presented to the ED for dizziness and was worked up for a code stroke (08/13./20), and incidentally found to have a small anterior comm aneurysm measuring 3 mm on CT head. The patient has a PHASES score of 5 with 1.3% risk of rupture in 5 years. Other consideration is that the aneurysm shows high aspect ratio, which could be a feature of high risk aneurysm. For better visualization of the aneurysm, we recommend for the patient to undergo cerebral angiogram. A follow up telemedicine clinic visit will be scheduled to discuss image finding and plan of care.    The risks, benefits, side effects, and alternatives of therapy were discussed with the patient, who verbalizes understanding and agrees with the plan.  We discussed as well the natural history of aneurysms, consequences of rupture, and the risks/benefits of treatment.  The patient was educated about the signs and symptoms of stroke and brain hemorrhage and the  need to call immediately 911 in that event. The patient verbalized understanding, and is to return to our clinic according to our discussion after the new imaging or sooner with any new neurological symptoms. I spent 50% of my 60 minutes face-to-face with the patient discussing the examination, therapy, epidemiology, history, and natural course of the patient's condition.     PLAN:  1. Proceed with cerebral angiogram,   2. A telemedicine follow up to discuss image findings and plan of care    I, Davina Poke, MD, personally reviewed the history and examined the patient.  I agree with the findings and the assessment and plan of care as documented in this note.

## 2019-02-02 ENCOUNTER — Ambulatory Visit: Payer: MEDICARE

## 2019-02-02 DIAGNOSIS — Z01812 Encounter for preprocedural laboratory examination: Secondary | ICD-10-CM

## 2019-02-09 NOTE — Addendum Note
Addended by: Francee Gentile on: 02/09/2019 10:50 AM     Modules accepted: Orders

## 2019-02-11 ENCOUNTER — Inpatient Hospital Stay
Admit: 2019-02-11 | Discharge: 2019-02-11 | Disposition: A | Discharge status: Home / Self Care | Payer: MEDICARE | Source: Home / Self Care

## 2019-02-11 ENCOUNTER — Ambulatory Visit: Payer: MEDICARE

## 2019-02-11 DIAGNOSIS — H8110 Benign paroxysmal vertigo, unspecified ear: Secondary | ICD-10-CM

## 2019-02-11 DIAGNOSIS — R42 Dizziness and giddiness: Secondary | ICD-10-CM

## 2019-02-11 DIAGNOSIS — R55 Syncope and collapse: Secondary | ICD-10-CM

## 2019-02-11 LAB — APTT: APTT: 28.6 s (ref 24.4–36.2)

## 2019-02-11 LAB — Basic Metabolic Panel
CHLORIDE: 101 mmol/L (ref 96–106)
GFR ESTIMATE FOR NON-AFRICAN AMERICAN: >89 mL/min/{1.73_m2} (ref 8.6–10.4)

## 2019-02-11 LAB — CK, Total: CREATINE PHOSPHOKINASE, TOTAL: 61 U/L (ref 38–282)

## 2019-02-11 LAB — Troponin I: TROPONIN INTERPRETATION: NEGATIVE ng/mL (ref <0.1)

## 2019-02-11 LAB — Differential Automated: ABSOLUTE IMMATURE GRAN COUNT: 0.02 10*3/uL (ref 0.00–0.04)

## 2019-02-11 LAB — CBC: MEAN CORPUSCULAR VOLUME: 86.4 fL (ref 79.3–98.6)

## 2019-02-11 LAB — Prothrombin Time Panel: PROTHROMBIN TIME: 12 s (ref 11.5–14.4)

## 2019-02-11 MED ORDER — MECLIZINE HCL 25 MG PO TABS
25 mg | ORAL_TABLET | Freq: Three times a day (TID) | ORAL | 0 refills | Status: AC | PRN
Start: 2019-02-11 — End: ?

## 2019-02-11 MED ADMIN — SODIUM CHLORIDE 0.9 % IV BOLUS: 1000 mL | INTRAVENOUS | @ 21:00:00 | Stop: 2019-02-11 | NDC 00338004904

## 2019-02-11 MED ADMIN — MECLIZINE HCL 25 MG PO TABS: 25 mg | ORAL | @ 21:00:00 | Stop: 2019-02-11 | NDC 50268052315

## 2019-02-11 MED ADMIN — METOCLOPRAMIDE HCL 5 MG/ML IJ SOLN: 10 mg | INTRAVENOUS | @ 21:00:00 | Stop: 2019-02-11 | NDC 00409341401

## 2019-02-11 MED ADMIN — GADOBUTROL 1 MMOL/ML IV SOLN: 7.5 mL | INTRAVENOUS | @ 22:00:00 | Stop: 2019-02-11 | NDC 50419032511

## 2019-02-11 NOTE — ED Notes
COLLECTIVE?NOTIFICATION?02/11/2019 12:46?Porter, KarenMRN: [id number]    [name] patient encounter information:   AVW:?[id number]  Account [id number]  Billing Account [id number]      Criteria Met      2 Visits in 30 Days    Security and Safety  No recent Security Events currently on file    ED Care Guidelines  There are currently no ED Care Guidelines for this patient. Please check your facility's medical records system.            E.D. Visit Count (12 mo.)  Facility Visits   [facility] 2   Total 2   Note: Visits indicate total known visits.      Recent Emergency Department Visit Summary  Date Facility [facility] Type Diagnoses or Chief Complaint   Feb 11, 2019 [facility] [place] [facility]     Jan 22, 2019 [facility] [place] [facility]      1. Bipolar disorder, currently in remission, most recent episode      1. Benign paroxysmal vertigo, unspecified ear      3. Chest pain, unspecified      3. Other psychoactive substance abuse, in remission      5. Dizziness          Recent Inpatient Visit Summary  No recorded inpatient visits.     Care Team  [facility] Specialty Phone Fax Service Dates   [facility] Primary Care   Current      Collective Portal  This patient has registered at the [facility] Emergency Department   For more information visit: [email or web address]   PLEASE NOTE:     1.   Any care recommendations and other clinical information are provided as guidelines or for historical purposes only, and providers should exercise their own clinical judgment when providing care.    2.   You may only use this information for purposes of treatment, payment or health care operations activities, and subject to the limitations of applicable Collective Policies.    3.   You should consult directly with the organization that provided a care guideline or other clinical history with any questions about additional information or accuracy or completeness of information provided.    ? 2020 [place], [employer] - [email or web address]

## 2019-02-11 NOTE — ED Notes
Report received from Michelle, RN for one hour lunch relief.

## 2019-02-11 NOTE — ED Provider Notes
[facility]  Emergency Department Service Report    Karen Porter 63 y.o. female , presents with Dizziness      Triage   Arrived on 02/11/2019 at 12:46 PM   Arrived by Car [5]    ED Triage Vitals [02/11/19 1305]   Temp Temp src BP Heart Rate Resp SpO2 O2 Device Pain Score Weight   36.4 ???C (97.6 ???F) -- 149/67 81 16 100 % -- Eight --       Pre hospital care:       Allergies   Allergen Reactions   ??? Penicillins Rash       History   Porter is a 63 y.o. female with hx of anxiety, depression, substance abuse who presents to Karen ED for evaluation of gradual onset room-spinning dizziness that began three weeks ago. Karen Porter was seen here at this ED 01/22/2019 and diagnosed with ''benign paroxysmal positional vertigo,'' was placed on meclizine after discharge. 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 Was also seen at [facility] by ENT physician who prescribed Karen Porter with abx for left ear infection. Reports that sx are worsening, intermittent, and moderate. No alleviating factors noted. Associated blurry vision, ''pounding'' headache, tinnitus, nausea.      Karen history is provided by Karen Porter. No language interpreter was used.   Neurologic Problem   Karen primary symptoms include headaches, dizziness, visual change and nausea. Karen symptoms began more than 1 week ago. Karen symptoms are worsening. Karen neurological symptoms are diffuse.   Karen headache is associated with visual change.     Karen Porter describes Karen dizziness as a sensation of spinning. Dizziness also occurs with tinnitus and nausea.   Additional symptoms include tinnitus.         Past Medical History:   Diagnosis Date   ??? Amblyopia of right eye    ??? Anxiety    ??? 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 Depression    ??? Hypertension     Karen Porter states Karen Porter stopped taking bp medication 11/25 because it was under conrol with diet   ??? Insomnia ??? Retinal detachment, right         Past Surgical History:   Procedure Laterality Date   ??? BUNIONECTOMY     ??? CATARACT EXTRACTION  1986    right eye   ??? OVARIAN CYST REMOVAL          Past Family History   family history includes Heart disease in her maternal grandfather and mother; Stroke in her maternal grandfather.     Past Social History   Karen Porter reports that Karen Porter has quit smoking. Karen Porter smoked 0.25 packs per day. Karen Porter has never used smokeless tobacco. Karen Porter reports current drug use. Drugs: Methamphetamines and Cocaine. Karen Porter reports that Karen Porter does not drink alcohol. No history on file for sexual activity.     Review of Systems   HENT: Positive for tinnitus.    Eyes:        + blurry vision   Gastrointestinal: Positive for nausea.   Neurological: Positive for dizziness and headaches.   All other systems reviewed and are negative.    Physical Exam   Physical Exam  Vitals signs and nursing note reviewed.   Constitutional:       General: Karen Porter is not in acute distress.     Appearance: Karen Porter  is well-developed.   HENT:      Head: Normocephalic and atraumatic.   Eyes:      Conjunctiva/sclera: Conjunctivae normal.   Neck:      Musculoskeletal: Normal range of motion and neck supple.   Cardiovascular:      Rate and Rhythm: Normal rate and regular rhythm.   Pulmonary:      Effort: Pulmonary effort is normal. No respiratory distress.      Breath sounds: Normal breath sounds.   Abdominal:      Palpations: Abdomen is soft.      Tenderness: There is no abdominal tenderness. There is no guarding or rebound.   Musculoskeletal: Normal range of motion.   Lymphadenopathy:      Cervical: No cervical adenopathy.   Skin:     General: Skin is warm and dry.   Neurological:      Mental Status: Karen Porter is alert.      Sensory: No sensory deficit.      Comments: Moving all four extremities.   Psychiatric:         Behavior: Behavior normal.       ED Course          Laboratory Results   Labs Reviewed - No data to display    Imaging Results No orders to display       Administered Medications     Medication Administration from 02/11/2019 1246 to 02/11/2019 1327     None          Procedures   ED ECG interpretation  Date/Time: 02/11/2019 1:20 PM  Performed by: Pablo Ledger, MD  Authorized by: Pablo Ledger, MD     ECG reviewed by ED Physician in Karen absence of a cardiologist: yes    Previous ECG:     Previous ECG:  Unavailable  Interpretation:     Interpretation: normal    Rate:     ECG rate:  64    ECG rate assessment: normal    Rhythm:     Rhythm: sinus rhythm    Comments:      No acute ST changes.      MDM    ED Course:  Nursing note reviewed.  Previous medical records were obtained and reviewed by myself    I visited Karen Porter to obtain history and perform Karen physical exam. Orders placed for MR intra-auditory canals wo+w contrast, laboratory analysis, ECG 12 lead.  Porter will be administered NS 1L IV, metoclopramide 10 mg IV, meclizine 25 mg oral.    Laboratory Results (as interpreted by me):  ***    Radiology Results (as interpreted by me):   ***    Medical Decision Making:  Shelena Waymire presents to Karen ED today with chief complaint of dizziness.  On exam, Karen Porter exhibits no acute exam findings.     ***FOR DISCHARGE***Given Karen Porter's physical exam, laboratory analysis, and imaging studies, my impression is Karen Porter is experiencing an episode of ***.  Therefore, I feel comfortable discharging Karen Porter home with close outpatient follow up.    Discharge Instructions:  I advised Karen Porter to follow up with their primary care physician on an outpatient basis over Karen next 24-48 hours, and recommended prompt  return to Karen emergency department if they have additional concerns or note worsening symptoms.  Karen Porter indicates understanding of these issues and agrees with Karen plan.  A copy of Karen ED  workup results was provided to Karen Porter as well.    ***  FOR ADMIT***Given Karen Porter's physical exam, laboratory analysis, and imaging studies, my impression is Karen Porter is experiencing an episode of ***. Given Karen above findings and discussion, I strongly believe Karen Porter will greatly benefit from an inpatient admission for further evaluation and treatment.    Clinical Impression   No diagnosis found.    Prescriptions     New Prescriptions    No medications on file       Disposition and Follow-up   Disposition:     Future Appointments   Date Time Provider Department Center   02/16/2019 10:45 AM PUI SCREENING SM [facility] PREOP NURSE9 CPN WLS IC [facility]   02/18/2019 10:30 AM RR IR 05N-RADIOLOGY NEURO RR IR [place] [facility]       Follow up with:  No follow-up provider specified.    Return precautions are specified on After Visit Summary.    Karen documentation on this chart was performed by Beckey Downing, scribed for Pablo Ledger, MD    02/11/2019 1:41 PM     ***

## 2019-02-11 NOTE — ED Notes
Pt verbalized understanding of discharge instructions and prescription given. Pt to follow up with Neurology and PCP as directed; return to ER if symptoms worsen. Pt states she has no other questions regarding discharge. Pt ambulatory; gait steady. NAD noted. VSS.

## 2019-02-11 NOTE — ED Notes
Waiting for patient to return from MRI.

## 2019-02-12 ENCOUNTER — Ambulatory Visit: Payer: MEDICARE | Attending: Acute Care

## 2019-02-16 ENCOUNTER — Ambulatory Visit: Payer: MEDICARE

## 2019-02-16 ENCOUNTER — Institutional Professional Consult (permissible substitution): Payer: MEDICARE

## 2019-02-16 DIAGNOSIS — M542 Cervicalgia: Secondary | ICD-10-CM

## 2019-02-16 DIAGNOSIS — Z01812 Encounter for preprocedural laboratory examination: Secondary | ICD-10-CM

## 2019-02-16 LAB — COVID-19 PCR: COVID-19 PCR: NOT DETECTED

## 2019-02-16 MED ADMIN — KETOROLAC TROMETHAMINE 30 MG/ML INJ (FAM USE ONLY): 30 mg | INTRAMUSCULAR | @ 19:00:00 | Stop: 2019-02-16

## 2019-02-16 NOTE — Progress Notes
PATIENTKytzia Porter   MRN: [id number]   DOB: [birth year]   DATE OF SERVICE:  02/16/2019   CARE TEAM: Patient Care Team:  Carroll Kinds, MD as PCP - General (Internal Medicine)    Subjective:   63 y.o.female    Awoke with R sided neck pain this AM  Points to R trap/R cerv paravert  Hurts with movement  No known injury/strain  No radiation to arm  No HA  No neuro symps  No fever/chills  No other myalgia  COVID inventory neg    Just had covid pcr pre-procedural this AM    A thorough 10 system review was conducted and the pertinent positives and negatives are listed in HPI. The remainder are negative.        Past Medical History:   Diagnosis Date   ? Amblyopia of right eye    ? Anxiety    ? 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 Depression    ? Hypertension     pt states she stopped taking bp medication 11/25 because it was under conrol with diet   ? Insomnia    ? Retinal detachment, right      Patient Active Problem List   Diagnosis   ? Senile nuclear sclerosis   ? Aphakic open-angle glaucoma, right eye   ? Amblyopia   ? Open angle with borderline findings, high risk   ? Aphakia of right eye   ? Chorioretinal scar of right eye   ? Chest pain at rest   ? Chest pain radiating to arm   ? History of substance abuse (HCC/RAF)   ? 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 Acute chest pain       Outpatient Medications Prior to Visit   Medication Sig   ? Divalproex Sodium (DEPAKOTE PO) Take 1,000 mg by mouth daily.   ? hydrOXYzine 25 mg tablet TAKE TWO (2) TABLETS BY MOUTH ONCE A DAY, AS NEEDED   ? irbesartan (AVAPRO) 150 mg tablet Take 150 mg by mouth daily.   ? metoprolol succinate 50 mg 24 hr tablet TAKE 1 TABLET BY MOUTH DAILY   ? BusPIRone HCl (BUSPAR PO) Take 10 mg by mouth three (3) times daily .   ? cyclobenzaprine 10 mg tablet Take 1 tablet (10 mg total) by mouth two (2) times daily as needed for Muscle spasms. (Patient not taking: Reported on 01/29/2019.) ? divalproex 500 mg 24 hr tablet TAKE 1 TABLET BY MOUTH TWICE A DAY   ? levoFLOXacin 500 mg tablet Take 500 mg by mouth daily.   ? lisinopril 5 mg tablet Take 5 mg by mouth daily.   ? meclizine 25 mg tablet Take 1 tablet (25 mg total) by mouth three (3) times daily as needed. (Patient not taking: Reported on 01/29/2019.)   ? meclizine 25 mg tablet Take 1 tablet (25 mg total) by mouth three (3) times daily as needed. (Patient not taking: Reported on 02/16/2019.)   ? olanzapine 10 mg tablet Take 5 mg by mouth at bedtime as needed.     No facility-administered medications prior to visit.        Allergies   Allergen Reactions   ? Penicillins Rash       Social History     Tobacco Use   ? Smoking status: Former Smoker     Packs/day: 0.25   ? Smokeless tobacco: Never Used   ? Tobacco comment: 1-3  cigs a day   Substance Use Topics   ? Alcohol use: No   ? Drug use: Yes     Types: Methamphetamines, Cocaine     Comment: 5 years sober        Family History   Problem Relation Age of Onset   ? Heart disease Mother    ? Stroke Maternal Grandfather    ? Heart disease Maternal Grandfather            Objective:   BP 113/76  ~ Pulse (!) 110  ~ Temp 36.6 ?C (97.8 ?F) (Forehead)  ~ SpO2 98%     No acute distress.  No cyanosis or clubbing  Cognition and affect normal  Anicteric  Well hydrated  Norm conjunctiva and EOM  Moist mucous membranes  Normal respiratory effort. No accessory muscle use  No JVD or peripheral edema  No gait or balance abnormality  No protected joints or muscles  Skin norm    No vertebral tender  Flex/ext OK  Rotation/lateral flex - pain  Tender R trap/cerv paravert  Neuro grossly norm      MedicalDecisionMaking/Assessment/Plan:   In addition to the above information?  Labs Reviewed:  Imaging Reviewed:  EKG interpreted:  Other tests reviewed:  Discussed with family or other physicians:    DX:  Muscular strain  No covid symps OW    TX:  toradol  Advil  Heat  ROMEX Pt advised re symptoms and signs requiring urgent re-evaluation.   Otherwise to F/U with PCP if SWOP.    The above plan of care, diagnosis, orders, and follow-up were discussed with the patient.  The patient had all questions answered satisfactorily and understands this recommended plan of care.  See AVS for additional information and counseling materials provided to the patient.     Author:  Melburn Popper. Larinda Buttery, MD, MS 02/16/2019 11:59 AM

## 2019-02-18 ENCOUNTER — Inpatient Hospital Stay
Admit: 2019-02-18 | Discharge: 2019-02-19 | Disposition: A | Discharge status: Home / Self Care | Payer: PRIVATE HEALTH INSURANCE | Source: Ambulatory Visit | Attending: Acute Care

## 2019-02-18 DIAGNOSIS — I671 Cerebral aneurysm, nonruptured: Secondary | ICD-10-CM

## 2019-02-18 MED ADMIN — MIDAZOLAM HCL (PF) 2 MG/2ML IJ SOLN: INTRAVENOUS | @ 19:00:00 | Stop: 2019-02-19 | NDC 00409230517

## 2019-02-18 MED ADMIN — IOHEXOL 300 MG/ML IJ SOLN: 40500 mg | INTRAVENOUS | @ 20:00:00 | Stop: 2019-02-18 | NDC 00407141361

## 2019-02-18 MED ADMIN — FENTANYL CITRATE (PF) 100 MCG/2ML IJ SOLN: INTRAVENOUS | @ 18:00:00 | Stop: 2019-02-19 | NDC 00641602725

## 2019-02-18 MED ADMIN — MIDAZOLAM HCL (PF) 2 MG/2ML IJ SOLN: INTRAVENOUS | @ 18:00:00 | Stop: 2019-02-19 | NDC 00409230517

## 2019-02-18 MED ADMIN — FENTANYL CITRATE (PF) 100 MCG/2ML IJ SOLN: INTRAVENOUS | @ 19:00:00 | Stop: 2019-02-19 | NDC 00641602725

## 2019-02-18 NOTE — Op Note
Dr Jarrett Soho at bedside explaining findings of procedure.

## 2019-02-18 NOTE — H&P
UPDATED H&P REQUIREMENT    For [facility] and [name]    WHAT IS THE STATUS OF THE PATIENT'S MOST CURRENT HISTORY AND PHYSICAL?   - The most current H&P is >24 hours and <30 days, and having examined the patient, I attest that there have been no changes. (This suffices as an update to the H&P).      REFER TO MEDICAL STAFF POLICIES REGARDING PRE-PROCEDURE HISTORY AND PHYSICAL EXAMINATION AND UPDATED H&P REQUIREMENTS BELOW:    [facility] and -[name] Medical Staff Policy 200 - For Patients Undergoing Procedures Requiring Moderate or Deep Sedation, General Anesthesia or Regional Anesthesia    Contents of a History and Physical Examination (H&P):    The H&P shall consist of chief complaint, history of present illness, allergies and medications, relevant social and family history, past medical history, review of systems and physical examination, and assessment and plan appropriate to the patient's age.    For Patients Undergoing Procedures Requiring Moderate or Deep Sedation, General Anesthesia or Regional Anesthesia:    1. An H&P shall be performed within 24 hours prior to the procedure by a qualified member of the medical staff or designee with appropriate privileges, except as noted in item 2 below.    2. If a complete history and physical was performed within thirty (30) calendar days prior to the patient's admission to the Medical Center for elective surgery, a member of the medical staff assumes the responsibility for the accuracy of the clinical information and will need to document in the medical record within twenty-four (24) hours of admission and prior to surgery or major invasive procedure, that they either attest that the history and physical has been reviewed and accepted, or document an update of the original history and physical relevant to the patient's current  clinical status.    3. Providing an H&P for patients undergoing surgery under local anesthesia is at the discretion of the Attending Physician.     4. When a procedure is performed by a dentist, podiatrist or other practitioner who is not privileged to perform an H&P, the anesthesiologist's assessment immediately prior to the procedure will constitute the 24 hour re-assessment.The dentist, podiatrist or other practitioner who is not privileged to perform an H&P will document the history and physical relevant to the procedure.    5. If the H&P and the written informed consent for the surgery or procedure are not recorded in the patient's medical record prior to surgery, the operation shall not be performed unless the attending physician states in writing that such a delay could lead to an adverse event or irreversible damage to the patient.    6. The above requirements shall not preclude the rendering of emergency medical or surgical care to a patient in dire circumstances.

## 2019-02-19 MED ADMIN — ACETAMINOPHEN 325 MG PO TABS: 650 mg | ORAL | Stop: 2019-02-19 | NDC 00904677361

## 2019-02-19 NOTE — Op Note
Endorsed to Lacrisha

## 2019-03-06 ENCOUNTER — Telehealth: Payer: MEDICARE

## 2019-03-06 ENCOUNTER — Ambulatory Visit: Payer: PRIVATE HEALTH INSURANCE

## 2019-03-06 ENCOUNTER — Ambulatory Visit: Payer: MEDICARE

## 2019-03-06 DIAGNOSIS — E861 Hypovolemia: Secondary | ICD-10-CM

## 2019-03-06 DIAGNOSIS — Z7189 Other specified counseling: Secondary | ICD-10-CM

## 2019-03-06 DIAGNOSIS — R51 Headache: Secondary | ICD-10-CM

## 2019-03-06 DIAGNOSIS — G43901 Migraine, unspecified, not intractable, with status migrainosus: Secondary | ICD-10-CM

## 2019-03-07 ENCOUNTER — Inpatient Hospital Stay
Admit: 2019-03-07 | Discharge: 2019-03-07 | Disposition: A | Discharge status: Home / Self Care | Payer: MEDICARE | Source: Home / Self Care

## 2019-03-07 LAB — Differential Automated: ABSOLUTE NEUT COUNT: 4.13 10*3/uL (ref 1.80–6.90)

## 2019-03-07 LAB — Basic Metabolic Panel
ANION GAP: 12 mmol/L (ref 8–19)
UREA NITROGEN: 13 mg/dL (ref 7–22)

## 2019-03-07 LAB — Sedimentation Rate, Erythrocyte: SEDIMENTATION RATE, ERYTHROCYTE: 46 mm/h — ABNORMAL HIGH (ref <=25)

## 2019-03-07 LAB — CBC: MEAN CORPUSCULAR HEMOGLOBIN: 27.5 pg (ref 26.4–33.4)

## 2019-03-07 LAB — Extra Light Blue Top

## 2019-03-07 LAB — Extra Light Green Top

## 2019-03-07 MED ADMIN — KETOROLAC TROMETHAMINE 30 MG/ML IJ SOLN: 30 mg | INTRAVENOUS | @ 04:00:00 | Stop: 2019-03-07 | NDC 72611072225

## 2019-03-07 MED ADMIN — SODIUM CHLORIDE 0.9 % IV BOLUS: 500 mL | INTRAVENOUS | @ 04:00:00 | Stop: 2019-03-07 | NDC 00338954304

## 2019-03-07 MED ADMIN — PROCHLORPERAZINE EDISYLATE 10 MG/2ML IJ SOLN: 10 mg | INTRAVENOUS | @ 04:00:00 | Stop: 2019-03-07 | NDC 14789070002

## 2019-03-07 MED ADMIN — IOHEXOL 350 MG/ML IV SOLN: 100 mL | INTRAVENOUS | @ 06:00:00 | Stop: 2019-03-07 | NDC 00407141491

## 2019-03-07 NOTE — ED Notes
Patient transported to CT 

## 2019-03-07 NOTE — Progress Notes
PATIENTEllysia Porter  MRN: [id number]  DOB: August 14, 1955  DATE OF SERVICE: 03/06/2019  CHIEF COMPLAINT:   Chief Complaint   Patient presents with   ? Headache      HPI   Karen Porter is a 63 y.o. female here complains of headache since 8 o'clock this morning. Headache is in bilateral temple region, 10/10 in severity and ''worst headache in my life'', movement makes it worse, nauseated and threw up a few times today. No neck stiffness but has tingling on the right side of her body. Newly diagnosed with cerebral aneurysm.       PMHx/CHRONIC CONDITIONS/STABILITY        Past Medical History:   Diagnosis Date   ? Amblyopia of right eye    ? Anxiety    ? 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 Depression    ? Hypertension     pt states she stopped taking bp medication 11/25 because it was under conrol with diet   ? Insomnia    ? Retinal detachment, right      Allergies   Allergen Reactions   ? Penicillins Rash     No outpatient medications have been marked as taking for the 03/06/19 encounter (Telemedicine) with Antony Blackbird, MD.     Family History   Problem Relation Age of Onset   ? Heart disease Mother    ? Stroke Maternal Grandfather    ? Heart disease Maternal Grandfather        There is no immunization history on file for this patient.  Social History     Tobacco Use   ? Smoking status: Former Smoker     Packs/day: 0.25   ? Smokeless tobacco: Never Used   ? Tobacco comment: 1-3 cigs a day   Substance Use Topics   ? Alcohol use: No     Past Surgical History:   Procedure Laterality Date   ? BUNIONECTOMY     ? CATARACT EXTRACTION  1986    right eye   ? OVARIAN CYST REMOVAL           REVIEW OF SYSTEMS    Negative except HPI    PHYSICAL EXAMINATION    VITAL SIGNS: There were no vitals taken for this visit.   Wt Readings from Last 3 Encounters:   02/18/19 131 lb 2.8 oz (59.5 kg)   01/29/19 132 lb (59.9 kg)   03/05/18 115 lb (52.2 kg)       General appearance - alert, well appearing, and in no distress Mental status - alert, oriented to person, place, and time  Eyes - no conjunctivitis noted  Ears - hearing grossly normal bilaterally  Chest - no tachypnea, normal respirating effort, no audible wheezing or labored breathing  Back exam - no pain with motion  Musculoskeletal - no joint deformity or swelling  Neurological - she is able to move her neck all directions. Walking with normal gait. No visible weakness in extremities.   Skin - no rash        LABS  Lab Results   Component Value Date    WBC 5.54 02/11/2019    HGB 11.8 02/11/2019    HCT 37.0 02/11/2019    PLT 366 02/11/2019     Lab Results   Component Value Date    NA 141 02/11/2019    K 4.2 02/11/2019    CL 101 02/11/2019    CO2 28 02/11/2019  BUN 13 02/11/2019    CREAT 0.66 02/11/2019    GLUCOSE 79 02/11/2019    CALCIUM 9.3 02/11/2019    ALT 8 01/22/2019    AST 11 (L) 01/22/2019    BILITOT 0.3 01/22/2019    ALKPHOS 90 01/22/2019    ALBUMIN 3.9 01/22/2019     No results found for: TSH, T3AUTO, T4AUTO, T4INDX  No results found for: HGBA1C  No results found for: CHOL, CHOLHDL, CHOLDLCAL, CHOLDLQ, TRIGLY  No results found for: [facility]    ASSESSMENT & PLAN     Chief Complaint   Patient presents with   ? Headache          Diagnoses and all orders for this visit:  Reviewed and summarized record.   History of brain aneurysm    Acute nonintractable headache, unspecified headache type  IR cerebral angiogram on 02/19/2019 showed saccular Acomm aneurysm, measuring 3.17 x 3.75 mm with a neck measuring 3.51mm.  10/10 worst headache in life today  Refer to ER to rule out rupture brain aneurysm.   Informed patient that she may need to have lumbar puncture in order to diagnose early rupture.   She verbalized understanding and will consent to the procedure.     Advice given about COVID-19 virus infection  Advised her to practice hand hygiene and social distancing due to COVID outbreak.        There are no Patient Instructions on file for this visit. No follow-ups on file.    Author:  Antony Blackbird 03/06/2019

## 2019-03-07 NOTE — ED Notes
Report received from [facility]. Pt resting in bed comfortably, AOx4, NAD. Waiting for final radiology results.

## 2019-03-07 NOTE — ED Notes
COLLECTIVE?NOTIFICATION?03/06/2019 20:55?Mika, Serita?MRN: [id number]    [name] patient encounter information:   AVW:?[id number]  Account [id number]  Billing Account [id number]      Criteria Met      2 Visits in 30 Days    Security and Safety  No recent Security Events currently on file    ED Care Guidelines  There are currently no ED Care Guidelines for this patient. Please check your facility's medical records system.            E.D. Visit Count (12 mo.)  Facility Visits   [facility] 3   Total 3   Note: Visits indicate total known visits.      Recent Emergency Department Visit Summary  Date Facility [facility] Type Diagnoses or Chief Complaint   Mar 06, 2019 [facility] [place] [facility]     Feb 11, 2019 [facility] [place] [facility]      1. Dizziness      1. Syncope and collapse      2. Dizziness and giddiness      3. Benign paroxysmal vertigo, unspecified ear      Jan 22, 2019 [facility] [place] [facility]      1. Bipolar disorder, currently in remission, most recent episode      1. Benign paroxysmal vertigo, unspecified ear      3. Chest pain, unspecified      3. Other psychoactive substance abuse, in remission      5. Dizziness          Recent Inpatient Visit Summary  No recorded inpatient visits.     Care Team  [facility] Phone Fax Service Dates   [facility]   Current      Collective Portal  This patient has registered at the [facility] Emergency Department   For more information visit: [email or web address]   PLEASE NOTE:     1.   Any care recommendations and other clinical information are provided as guidelines or for historical purposes only, and providers should exercise their own clinical judgment when providing care.    2.   You may only use this information for purposes of treatment, payment or health care operations activities, and subject to the limitations of applicable Collective Policies.    3.   You should consult directly with the organization that provided a care guideline or other clinical history with any questions about additional information or accuracy or completeness of information provided.    ? 2020 [place], [employer] - [email or web address]

## 2019-03-07 NOTE — ED Provider Notes
Chief Complaint:   Headache (I've had the worst headache today I took 2 excedrine but it didn't help. I have a brain aneurysm and im concerned its leaking or something.)    History:  Karen Porter is a 63 y.o. female who presents to the ED with complaint of severe, refractory intractable headache that began early this morning at 3am (18 hours ago) that woke her from sleep.     Described as achy, R>L, severe but not worst of her life, starting bitemporal and radiating around to back of head, similar to past migraines but worse, mild temporary relief with 2 Excedrin this morning. HA is also provoked by light and sound. Pt reports that she gets episodes like this 1-2 times per year. Associated blurred vision, nausea and 2 episodes of non-bloody, non-bilious vomiting. Denies weakness in extremities.     Pt reports that she was seen at ED for headache 8/13 with associated dizziness and falls, was dx with brain aneurysm and is discussing potential surgery to have stent placed.   Was seen at [facility] and sent to ED for further workup to see if there is leakage.     Pt has been maintaining social distance, wearing a mask, and following COVID precautions without any known exposures.    PMH:   Past Medical History:   Diagnosis Date   ? Amblyopia of right eye    ? Anxiety    ? 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 Depression    ? Hypertension     pt states she stopped taking bp medication 11/25 because it was under conrol with diet   ? Insomnia    ? Retinal detachment, right      PSH:   Past Surgical History:   Procedure Laterality Date   ? BUNIONECTOMY     ? CATARACT EXTRACTION  1986    right eye   ? OVARIAN CYST REMOVAL       FamHx: Noncontributory except for as noted in history of present illness.    Medications:   Previous Medications    BUSPIRONE HCL (BUSPAR PO)    Take 10 mg by mouth three (3) times daily .    CYCLOBENZAPRINE 10 MG TABLET    Take 1 tablet (10 mg total) by mouth two (2) times daily as needed for Muscle spasms.    DIVALPROEX 500 MG 24 HR TABLET    TAKE 1 TABLET BY MOUTH TWICE A DAY    DIVALPROEX SODIUM (DEPAKOTE PO)    Take 1,000 mg by mouth daily.    HYDROXYZINE 25 MG TABLET    TAKE TWO (2) TABLETS BY MOUTH ONCE A DAY, AS NEEDED    IRBESARTAN (AVAPRO) 150 MG TABLET    Take 150 mg by mouth daily.    LEVOFLOXACIN 500 MG TABLET    Take 500 mg by mouth daily.    LISINOPRIL 5 MG TABLET    Take 5 mg by mouth daily.    MECLIZINE 25 MG TABLET    Take 1 tablet (25 mg total) by mouth three (3) times daily as needed.    MECLIZINE 25 MG TABLET    Take 1 tablet (25 mg total) by mouth three (3) times daily as needed.    METOPROLOL SUCCINATE 50 MG 24 HR TABLET    TAKE 1 TABLET BY MOUTH DAILY    OLANZAPINE 10 MG TABLET    Take 5 mg by mouth at bedtime as needed.  Allergies: She is allergic to penicillins.    Social History: Marital Status: divorced. She  reports that she has quit smoking. She smoked 0.25 packs per day. She has never used smokeless tobacco.. She  reports no history of alcohol use..    Review of Systems: All systems reviewed and are negative except as per history of present illness.  General: no fevers, chills, sweats  Eyes + blurry vision, baseline R eye blindness (unchanged). no discharge  ENT no congestion  Resp no shortness of breath  Cardiac no chest pain   GI + nausea and vomiting. no diarrhea, rectal bleeding, or abdominal pain  GU normal urination  MSK no pain or swelling  Neuro + headache. no focal weakness or numbness  Psychiatric no depression    Exam:   VITAL SIGNS:   ED Triage Vitals   Temp Temp Source BP Heart Rate Resp SpO2 O2 Device Pain Score Weight   03/06/19 2056 03/06/19 2056 03/06/19 2056 03/06/19 2056 03/06/19 2056 03/06/19 2056 03/06/19 2056 03/06/19 2056 03/06/19 2104   36.6 ?C (97.9 ?F) Oral 148/84 (!) 100 16 97 % None (Room air) Ten 56.7 kg (125 lb)      Const:        Appropriately interactive Head:        Right greater than left temporal TTP. Atraumatic   Eyes:                 Normal Conjunctiva, normal external exam PERRL, EOMI, fundi normal on left, difficult to see on right.  +photophobia   ENT:                 Normal External Ears, Nose and Mouth. Dry mm   Neck:      No meningismus. Full range of motion.     Resp:         Clear to auscultation bilaterally   Cardio:         Regular rate and rhythm, no murmurs   Abd:                 Soft, non tender, non distended. Normal bowel sounds   Skin:                 No petechia or rashes   Back:         No midline or flank tenderness   Ext:                  No cyanosis, or edema   Neur:                Awake and alert, MAES, cerebellar intact   Psych:          Normal Mood and Affect    Labs:  Labs Reviewed   BASIC METABOLIC PANEL - Abnormal; Notable for the following components:       Result Value    Potassium 3.5 (*)     Glucose 107 (*)     All other components within normal limits   CBC (PERFORMABLE) - Abnormal; Notable for the following components:    Hemoglobin 11.2 (*)     Hematocrit 33.7 (*)     Platelet Count, Auto 414 (*)     Mean Platelet Volume 8.6 (*)     All other components within normal limits   SEDIMENTATION RATE, ERYTHROCYTE - Abnormal; Notable for the following components:    Sedimentation rate, Erythrocyte 46 (*)     All other  components within normal limits   CBC & AUTO DIFFERENTIAL    Narrative:     The following orders were created for panel order CBC & Plt & Diff.  Procedure                               Abnormality         Status                     ---------                               -----------         ------                     ZOX[[id number]]                          Abnormal            Final result               Differential, Automated[[id number]]                          Final result                 Please view results for these tests on the individual orders.   DIFFERENTIAL, AUTOMATED (PERFORMABLE)   RAINBOW DRAW TO LABORATORY Narrative:     The following orders were created for panel order [facility] Draw to Laboratory [facility], [place]).  Procedure                               Abnormality         Status                     ---------                               -----------         ------                     Cephas Darby WJX[[id number]]                             In process                 Extra Burna Mortimer OZH[[id number]]                            In process                   Please view results for these tests on the individual orders.   EXTRA LIGHT BLUE TOP   EXTRA LIGHT GREEN TOP     ***    Treatment:  Medications   iohexol (Omnipaque) 350 mg/mL inj 100 mL (has no administration in time range)   prochlorperazine 10 mg/2 mL inj 10 mg (10 mg IV Push Given 03/06/19 2125)   ketorolac 30 mg/mL inj 30 mg (30 mg IV Push Given 03/06/19 2125)  sodium chloride 0.9% IV soln bolus 500 mL (0 mLs Intravenous Stopped 03/06/19 2224)       The patient responded appropriately to treatment and improved.    Medical Decision Making and ED Course:  Prior and current EMR records were reviewed by myself as available and clinically relevant. Nurses notes and flow sheets were reviewed by myself.     Medical care in this case was highly complex due to concern for morbid process such as metabolic acidodis, electrolyte abnormalities such as hyper/hypokalemia, hyper/hyponatremia, and and hyper/hypocalcemia, uremia, HUS, ARF, ICH, SDH, EDH, SAH and an additional, extensive differential diagnosis that also includes status migrainosus, ruptured aneurysm, tension headache, temporal arteritis.    Sula Covalt 's care was highly complex due to nature of the presenting problem, dehydration as evidenced by dry mucous membranes, potential for morbid or mortal disease, and needIV fluids for adequate volume resuscitation     The patient's care was COMPLEX due to concern for possible SARS/CoV-2/COVID-19 infection.  The patient was evaluated in the context of the global SAR-CoV-2/COVID-19 pandemic which necessitated consideration that the patient may be at risk for infection.  Institutional protocols that pertain to patients at risk for COVID-19 are in a state of rapid change based on CDC and local guidelines.  These policies and guidelines were followed and met standards of care during the patients care in the ED.The patient was high risk due to age, comorbid conditions, and recent healthcare exposures.    In addition, morbid disease such as HIV, syphilis, autoimmune disease, and lupus are in the differential, although the patient does not present with significant known risk factors for them.  These can, however, be further worked up in follow up.    Extensive review of the medical records was performed including prior notes, imaging studies, and labs.   Pt had an IR cerebral angiogram on 9/9 showing anterior sacular acom aneurysm   MR 9/2 of IAC was negative  CT 8/13 showed normal perfusion, no infarct and only a 3 mm ACO nonruptured aneurysm    Labs were reviewed and noted as above.      REASSESSMENTS    Patient presents with worse/severe headache just greater than 12 hrs after onset, therefore noncon head CT would not rule out SAH. THerefore, will obtain noncon head CT plus CTA to assess for possible change to aneurysm    2200 HA improved p toradol/compazine.    2230 Labs were reviewed and noted.  CBC was unremarkable  BMP was unremarkable  CT pending, CT called.    ***** The patient has normal pulse ox, normal ambulatory pulse ox, no signs of respiratory distress, and is otherwise well appearing and is able to practice self-isolation and self-care.  Therefore, she will be d/c'd and return if worsening symptoms develop.    DIAGNOSTIC PROCEDURES    1. Diagnostic Test Interpretation by me.  Pulse oximetry was 97% consistent with normal room air oxygenation.    2. Diagnostic Test Interpretation by me.  EKG rhythm monitor strip.  The rhythm appears to be normal sinus with a normal heart rate.  No ectopic beats or dysrhythmia was noted.    3. Diagnostic Test Interpretation by me.Ekg: rhythm normal, rate normal.  Intervals normal.  Twaves nonspecific changes, ST segments normal.  No evidence of BBB, heart block, ischemia, infarct, or atrial or ventricular dysrhythmia.  No significant change from prior 01/23/19        THERAPEUTIC PROCEDURES  Therapeutic Procedure - IV infusion.  Patient  was given IV fluids and medication under my supervision due to signs and symptoms of dehydration requiring IV administration for adequate symptom relief.    Therapeutic Procedure - IV infusion.  Patient was given IV medication under my supervision due to signs and symptoms of possible morbid disease.      Therapeutic Procedure - COVID-19 counseling.  Patient counseled to maintain social distance, wear a mask, and avoid mass gatherings or unnecessary exposures to others.  Hand hygiene was emphasized.      Consults:   (1) Case d/w CT tech to expedite testing which was somewhat delayed.      Impression:  1. Status migrainosus    2. Hypovolemia    3. Educated About Covid-19 Virus Infection      Disposition and Follow Up:  Discharged home in stable and improved condition  FU at Dr. Romeo Rabon    The documentation on this chart was performed by Cleophas Dunker Kalan Yeley, scribed for Santa Genera., MD    03/06/2019 9:11 PM     All scribe entries and documentation made by the scribe were entered at my direction.  I have reviewed this medical record and agree to the accuracy and completeness of the content entered by the scribe.  The documentation recorded by the scribe accurately reflects the service I personally performed and the decisions made by me.    Santa Genera., MD 10:31 PM 03/06/2019

## 2019-03-11 ENCOUNTER — Ambulatory Visit: Payer: MEDICARE

## 2019-03-11 DIAGNOSIS — I671 Cerebral aneurysm, nonruptured: Secondary | ICD-10-CM

## 2019-03-11 NOTE — Progress Notes
INTERVENTIONAL NEURORADIOLOGY  PROGRESS NOTE    PATIENT NAME: Karen Porter     DOB: [birth year]     MR#: [id number]     DATE: 03/11/2019     REFERRING PHYSICIAN: Tamsen Meek., MD     HISTORY OF PRESENT ILLNESS   Karen Porter is a 63 y.o. female history of amblyopia of right eye, anxiety, depression, HTN, insomnia, and right retinal detachment who presents for follow up am imaging review of an incidentally revealed anterior communicating aneurysm.     In review, she presented to [facility] ED on 01/22/19 due to acute onset dizziness, nausea, and emesis. She underwent stroke workup which revealed a small acomm aneurysm measuring 3 mm on CT head 01/22/19. She was seen by [facility] neuro-IR on 02/02/19, and subsequently underwent a cerebral angiogram which revealed an anteriorly projecting wide neck saccular Acomm aneurysm, measuring 3.17 x 3.75 mm with a neck measuring 3.45 mm. Patient arrived to [facility] ER on 03/06/19 due to a severe and intractable headache which was accompanied with blurred vision, nausea and emesis. CTA head was obtained to evaluate for aneurysmal changes which revealed no acute findings.   ???  Currently denies weakness, numbness, tingling, or slurred speech. She also denies any history of stroke, seizure or SAH. No family history of cerebral aneurysms.   ???  MEDICATIONS:     Current Outpatient Medications:   ???  BusPIRone HCl (BUSPAR PO), Take 10 mg by mouth three (3) times daily ., Disp: , Rfl:   ???  cyclobenzaprine 10 mg tablet, Take 1 tablet (10 mg total) by mouth two (2) times daily as needed for Muscle spasms. (Patient not taking: Reported on 01/29/2019.), Disp: 20 tablet, Rfl: 0  ???  divalproex 500 mg 24 hr tablet, TAKE 1 TABLET BY MOUTH TWICE A DAY, Disp: , Rfl:   ???  Divalproex Sodium (DEPAKOTE PO), Take 1,000 mg by mouth daily., Disp: , Rfl:   ???  hydrOXYzine 25 mg tablet, TAKE TWO (2) TABLETS BY MOUTH ONCE A DAY, AS NEEDED, Disp: , Rfl:   ???  irbesartan (AVAPRO) 150 mg tablet, Take 150 mg by mouth daily., Disp: , Rfl:   ???  levoFLOXacin 500 mg tablet, Take 500 mg by mouth daily., Disp: , Rfl:   ???  lisinopril 5 mg tablet, Take 5 mg by mouth daily., Disp: , Rfl:   ???  meclizine 25 mg tablet, Take 1 tablet (25 mg total) by mouth three (3) times daily as needed. (Patient not taking: Reported on 01/29/2019.), Disp: 20 tablet, Rfl: 0  ???  meclizine 25 mg tablet, Take 1 tablet (25 mg total) by mouth three (3) times daily as needed. (Patient not taking: Reported on 02/16/2019.), Disp: 20 tablet, Rfl: 0  ???  metoprolol succinate 50 mg 24 hr tablet, TAKE 1 TABLET BY MOUTH DAILY, Disp: , Rfl:   ???  olanzapine 10 mg tablet, Take 5 mg by mouth at bedtime as needed., Disp: , Rfl:       ALLERGIES:   Allergies   Allergen Reactions   ??? Penicillins Rash       PHYSICAL EXAMINATION:    Vital Signs:There were no vitals taken for this visit.    GEN:  Well developed, well nourished, in no acute distress.    SKIN:  PWD, no rashes noted.    HEENT:  Normocephalic.  Sclera anicteric.   EXT:   without clubbing, cyanosis or edema.  NEURO:    Mental Status:   General: Awake, alert  and oriented to person, place and time   Concentration and attention span: Normal   Fund of knowledge: Adequate recent and remote recall   Language: Fluent and articulate without evidence of aphasia or dysarthria  Cranial Nerves:   II: PERRL, visual fields full to confrontation   III, IV, VI: Gaze conjugate, EOMi, no nystagmus   V: Sensation intact and symmetric to light touch V1-V3   VII: Symmetric facial motor function bilaterally   VIII: Intact to finger rub bilaterally   IX, X: Palate elevates symmetrically   XI: Normal shrug bilaterally   XII: Tongue protrudes midline  The corneal reflexes were not tested  Motor:   5/5 muscle strength against resistance throughout.    Normal bulk and tone.   No fasciculations, myotonia or other abnormal movements.  Sensation:   Light touch: Intact and symmetric in the bilateral upper and lower extremities.  DTRs:    2+ patellar bilaterally Coordination:   Rapid alternating movements are normal, no dysdiadochokinesia  Gait:   Normal casual gait without ataxia.     Normal arm swing and step size.    Labs:  Lab Results   Component Value Date    NA 138 03/06/2019    K 3.5 (L) 03/06/2019    CL 100 03/06/2019    CO2 26 03/06/2019    GLUCOSE 107 (H) 03/06/2019    CREAT 0.82 03/06/2019    BUN 13 03/06/2019    CALCIUM 9.3 03/06/2019     Lab Results   Component Value Date    ALT 8 01/22/2019    AST 11 (L) 01/22/2019    ALKPHOS 90 01/22/2019    BILITOT 0.3 01/22/2019     Lab Results   Component Value Date    WBC 6.51 03/06/2019    HGB 11.2 (L) 03/06/2019    HCT 33.7 (L) 03/06/2019    MCV 82.6 03/06/2019    PLT 414 (H) 03/06/2019       IMAGING STUDIES:  CT brain wo contrast/CT brain angiogram w contrast: 03/07/19  IMPRESSION:  1.  No acute intracranial hemorrhage or mass effect. No evidence of significant intracranial stenosis or occlusion.   2.  Redemonstration of anteriorly projecting anterior communicating artery aneurysm measuring proximal 4 mm as above, not significantly changed since prior study.      CEREBRAL ANGIOGRAM: 02/18/19  IMPRESSION:   1.  There is an anteriorly projecting wide neck saccular Acomm aneurysm, measuring 3.17 x 3.75 mm with a neck measuring 3.72mm. There is a branch coming off of the  posterior aspect of the parent artery proximal to the Acomm aneurysm neck.      ASSESSMENT/DISCUSSION: Karen Porter is a 63 y.o. patient with a history of incidentally revealed anterior communicating aneurysm.      Today's imaging study demonstrates ***.  I have discussed the findings with the patient and recommended   .  The patient agrees with this plan.  The risks, benefits, side effects, and alternatives of therapy were discussed with the patient, who verbalizes understanding. I spent 50% of my 64 minutes face-to-face with the patient discussing the examination, therapy, epidemiology, history, and natural course of the patient's condition. The patient was again educated about the signs and symptoms of stroke and brain hemorrhage and the need to call immediately 911 in that event. The patient verbalized understanding, and is to return to our clinic according to our discussion after the new imaging or sooner with any new neurological symptoms.  PLAN:    1.         I, Davina Poke, MD,personally reviewed the history and examined the patient.  I agree with the findings and the assessment and plan of care as documented in this note.

## 2019-03-31 ENCOUNTER — Ambulatory Visit: Payer: MEDICARE

## 2019-04-03 ENCOUNTER — Ambulatory Visit: Payer: MEDICARE

## 2019-04-03 DIAGNOSIS — Z01812 Encounter for preprocedural laboratory examination: Secondary | ICD-10-CM

## 2019-04-06 ENCOUNTER — Ambulatory Visit: Payer: MEDICARE

## 2019-04-09 ENCOUNTER — Ambulatory Visit: Payer: MEDICARE | Attending: Acute Care

## 2019-04-14 ENCOUNTER — Ambulatory Visit: Payer: PRIVATE HEALTH INSURANCE

## 2019-04-14 ENCOUNTER — Ambulatory Visit: Payer: MEDICARE

## 2019-04-14 DIAGNOSIS — Z01812 Encounter for preprocedural laboratory examination: Secondary | ICD-10-CM

## 2019-04-14 NOTE — Progress Notes
Chief Complaint   Patient presents with    ILI/PUI       SUBJECTIVE:  Karen Porter is a 63 y.o. female here for pre-procedural COVID testing. She is having cerebral embolization done.  No cough or fever    OBJECTIVE:  Vitals: There were no vitals taken for this visit.  General: NAD, pleasant & cooperative  Head: EOMI, no facial lesions  Neuro: Normal sensation, no motor deficits  Skin: No ulcers, erythema, or skin breakdown  Psych: Alert, appropriate affect    ASSESSMENT/PLAN:    1. Pre-procedure lab exam  No COVID symptoms. Sample obtained in clinic. Follow-up as indicated.  - COVID-19 PCR, Nasopharyngeal      Geoffery Lyons, D.O.  [place]lie  04/14/2019

## 2019-04-15 ENCOUNTER — Ambulatory Visit: Payer: MEDICARE

## 2019-04-15 DIAGNOSIS — I671 Cerebral aneurysm, nonruptured: Secondary | ICD-10-CM

## 2019-04-15 LAB — COVID-19 PCR

## 2019-04-16 ENCOUNTER — Inpatient Hospital Stay: Admit: 2019-04-16 | Payer: MEDICARE | Source: Home / Self Care | Attending: Acute Care

## 2019-04-16 LAB — Glucose,POC: GLUCOSE,POC: 81 mg/dL (ref 65–99)

## 2019-04-16 LAB — VERIFYNOW PRU: VERIFYNOW PRU: 36 [PRU]

## 2019-04-16 LAB — APTT: APTT: >180 s — CR (ref 24.4–36.2)

## 2019-04-16 LAB — VERIFYNOW ASPIRIN: VERIFYNOW ASPIRIN: 569 {ARU}

## 2019-04-16 MED ADMIN — PHENYLEPHRINE HCL 10 MG/ML IV SOLN: INTRAVENOUS | @ 17:00:00 | Stop: 2019-04-16 | NDC 00641614225

## 2019-04-16 MED ADMIN — PHENYLEPHRINE HCL 10 MG/ML IV SOLN: INTRAVENOUS | @ 18:00:00 | Stop: 2019-04-16 | NDC 00641614225

## 2019-04-16 MED ADMIN — PHENYLEPHRINE HCL 10 MG/ML IV SOLN: INTRAVENOUS | @ 19:00:00 | Stop: 2019-04-16 | NDC 00641614225

## 2019-04-16 MED ADMIN — LABETALOL HCL 5 MG/ML IV SOLN: 5 mg | INTRAVENOUS | @ 21:00:00 | Stop: 2019-04-23 | NDC 72266010201

## 2019-04-16 MED ADMIN — PROPOFOL 200 MG/20ML IV EMUL: @ 19:00:00 | Stop: 2019-04-16 | NDC 63323026929

## 2019-04-16 MED ADMIN — LIDOCAINE HCL (CARDIAC) PF 100 MG/5ML IV SOSY: INTRAVENOUS | @ 17:00:00 | Stop: 2019-04-16 | NDC 00409132305

## 2019-04-16 MED ADMIN — GLYCOPYRROLATE 1 MG/5ML IJ SOLN: @ 19:00:00 | Stop: 2019-04-16 | NDC 70121139605

## 2019-04-16 MED ADMIN — SODIUM CHLORIDE 0.9 % IV SOLN: INTRAVENOUS | @ 17:00:00 | Stop: 2019-04-16 | NDC 00338004902

## 2019-04-16 MED ADMIN — DOCUSATE SODIUM 50 MG/5ML PO LIQD: 100 mg | ORAL | @ 23:00:00 | Stop: 2019-05-16

## 2019-04-16 MED ADMIN — PLASMA-LYTE A IV SOLN: INTRAVENOUS | @ 19:00:00 | Stop: 2019-04-16 | NDC 00338022104

## 2019-04-16 MED ADMIN — FENTANYL CITRATE (PF) 100 MCG/2ML IJ SOLN: INTRAVENOUS | @ 19:00:00 | Stop: 2019-04-16 | NDC 00641602725

## 2019-04-16 MED ADMIN — LABETALOL HCL 5 MG/ML IV SOLN: 5 mg | INTRAVENOUS | @ 23:00:00 | Stop: 2019-04-23 | NDC 72266010201

## 2019-04-16 MED ADMIN — ROCURONIUM BROMIDE 50 MG/5ML IV SOLN: INTRAVENOUS | @ 17:00:00 | Stop: 2019-04-16 | NDC 39822420002

## 2019-04-16 MED ADMIN — FENTANYL CITRATE (PF) 100 MCG/2ML IJ SOLN: INTRAVENOUS | @ 17:00:00 | Stop: 2019-04-16 | NDC 00641602725

## 2019-04-16 MED ADMIN — IOHEXOL 300 MG/ML IJ SOLN: 78000 mg | INTRA_ARTERIAL | @ 20:00:00 | Stop: 2019-04-16 | NDC 00407141363

## 2019-04-16 MED ADMIN — MORPHINE SULFATE (PF) 2 MG/ML IV SOLN: 2 mg | INTRAVENOUS | @ 22:00:00 | Stop: 2019-04-16 | NDC 00409189003

## 2019-04-16 MED ADMIN — NEOSTIGMINE METHYLSULFATE 10 MG/10ML IV SOLN: @ 19:00:00 | Stop: 2019-04-16 | NDC 43598052936

## 2019-04-16 MED ADMIN — PROPOFOL 200 MG/20ML IV EMUL: @ 17:00:00 | Stop: 2019-04-16 | NDC 63323026929

## 2019-04-16 MED ADMIN — PROTAMINE SULFATE 10 MG/ML IV SOLN: 25 mg | INTRAVENOUS | @ 23:00:00 | Stop: 2019-04-16 | NDC 63323022905

## 2019-04-16 MED ADMIN — HEPARIN (PORCINE) IN NACL 25000-0.45 UT/250ML-% IV SOLN: @ 18:00:00 | Stop: 2019-04-16 | NDC 00409765062

## 2019-04-16 MED ADMIN — HEPARIN (PORCINE) IN NACL 25000-0.45 UT/250ML-% IV SOLN: @ 17:00:00 | Stop: 2019-04-16 | NDC 00409765062

## 2019-04-16 NOTE — Progress Notes
Pharmaceutical Services  Admission Medication Reconciliation Note      Patient Name: Karen Porter  Medical Record Number: [id number]  Admit date: 04/16/2019 5:40 AM    Age: 63 y.o.  Sex: female  Allergies: Penicillins  Height:   Most recent documented height   04/16/19 1.6 m (5' 3'')     Actual Weight:   Most recent documented weight   04/16/19 59 kg   03/11/19 59.9 kg     Diagnosis: The patient is currently admitted with the following concerns/issues: Active Problems:    Cerebral aneurysm POA: Yes      Reported Medication History   I spoke with family to update the home medication list for this hospital admission.  Patient's daughter Dondra Prader [phone] verbally provided medication list.    PTA Medication List (discrepancies are noted)  Prior to Admission medications as of 04/16/19 1539   Medication Sig Notes Last Dose   aspirin 325 mg EC tablet 325 mg, Oral, Daily   04/16/2019    busPIRone 5 mg tablet 5 mg, Oral, 2 times daily      clopidogrel 75 mg tablet 75 mg, Oral, Daily   04/16/2019   divalproex 500 mg 24 hr tablet TAKE 1 TABLET BY MOUTH TWICE A DAY for bipolar disorder per daughter  Surescripts records confirm this formulation is being dispensed - daughter confirmed it is being given BID 04/15/2019    irbesartan (AVAPRO) 150 mg tablet 150 mg, Oral, Daily   04/15/2019    metoprolol succinate 50 mg 24 hr tablet TAKE 1 TABLET BY MOUTH DAILY   04/15/2019            Discharge Prescription Preference:   [id number] - [place], [place] - [address]  [address]  [place] [address]        The patient's medications have been reviewed and the PTA med list is updated.     Gwenyth Ober [place], 04/16/2019, 3:39 PM    ___________________________________________________________________________________________________________________    I reviewed the prior to admission medication list compiled by the medication reconciliation pharmacy technician and attest to the above. I spoke to Dondra Prader to confirm the indication for depakote - Dondra Prader stated that it is used for patient's bipolar disorder    The reconciliation of admission orders with PTA med list is complete.    Consider restarting home meds if/when medically appropriate post-op: depakote (for bipolar disorder per daughter), buspar and BP meds     Teresita Madura 04/16/2019 3:51 PM   Transitions of Care Pharmacist  [facility]  [address]  [place], [place] [address]  Phone: [phone]  Fax: [phone]

## 2019-04-16 NOTE — Nursing Note
1200: Handoff report received from anesthesia and IR MD to RN and [place] MD. Sepsis screen completed, neuro assessment done, pulses checked. Small amount of bleeding noted on L groin site. IR MD, Dr. Daralene Milch notified. Dressing removed, pressure held x20 minutes and dressing reapplied. MD and 5 lb weight placed.  Instructed RN not to sit up patient for 5 hours rather than 3 hours as originally ordered.

## 2019-04-16 NOTE — H&P
DATE OF SERVICE:  04/15/2019      PLANNED SURGERY:  The patient is going to be operated by Dr. Trixie Rude tomorrow, 04/16/2019, in [facility].    HISTORY OF PRESENT ILLNESS:  Karen Porter, who is a 63 year old lady, had episodes of headaches and dizziness and she went to [facility] emergency room. 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  Then it was arranged to have a stent insertion of this aneurysm to prevent further rupture.  The patient now is admitted to be taken care of by Dr. Trixie Rude in [facility].    PAST MEDICAL HISTORY:  Her past history includes multiple medical problems, including  1. History of hypertension. She has been on medication.    2. History of depression and bipolar disorder. She had suicidal tendency and she is on medication at present time.   3. History of positive hepatitis C because of substance abuse in the past.   4. History of right eye legally blind.    5. 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   6. History of bilateral breast implants.   7. History of ovarian cyst on the right side. It was removed.    8. 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    REVIEW OF SYSTEMS:  She is complaining of occasional dizziness, headache, and occasional dyspnea on exertion; but cardiac workup has been negative. She is complaining of left hip arthritis and left side sciatica and a spasm of muscle on the left side, left thigh, that has improved by some physical therapy.    CURRENT MEDICATIONS:  She has been using Imitrex for migraine headache. She is on vitamin D tablet. She is on Depakote 500 mg daily. She is on metoprolol 50 mg. She is on Avapro 150 mg for blood pressure, and she is getting Claritin 10 mg for her allergic rhinitis.    ALLERGIES: Has history of penicillin allergy.    PHYSICAL EXAMINATION:  Vital Signs: Weight is 130 pounds, blood pressure is 130/80. She is 5 feet 4 inches in height.  General: She is not in acute distress, but she is worried about her surgery. BMI is about 23. HEENT:  Head looks normal. Right eye blind. Left eye normal pupillary reaction. Neck: Supple. Jugular vein pressure is not elevated. Abdomen: Soft. No mass. Chest: Clear, no rales, no dullness. Somehow decreased breathing sounds because of COPD, chronic smoking.  Lower Extremities: There are some varicosities. Minimal ankle edema.    IMPRESSION:  1. 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  2. She has history of hypertension.  3. History of depression and bipolar on medication.  4. History of positive hepatitis C that has not been treated.  5. Right eye blindness, legally.  6. History of substance abuse in the past and chronic smoking in the past.  7. Varicose vein on the leg.  8. Bilateral breast implantation.  9. Ovarian cysts, right ovarian tumor removed.  10. Allergic rhinitis.  11. Low vitamin D level.    PLAN:  At the present time patient has been seen by cardiologist, Dr. Linton Flemings, and has been cleared for surgery. EKG is normal sinus and blood work has been discussed with the patient. At  the present time, there is no contraindication for surgery. She should continue her medication and blood pressure medicine after discharge.      Carroll Kinds, MD [phone])        JB/MODL CONF#: [id number]  D: 04/15/2019 17:48:38 T: 04/15/2019 19:01:21 DOCUMENT: [id number]

## 2019-04-16 NOTE — H&P
UPDATED H&P REQUIREMENT    For [facility] and [name]    WHAT IS THE STATUS OF THE PATIENT'S MOST CURRENT HISTORY AND PHYSICAL?   - The most current H&P is >24 hours and <30 days, and having examined the patient, I attest that there have been no changes. (This suffices as an update to the H&P).      REFER TO MEDICAL STAFF POLICIES REGARDING PRE-PROCEDURE HISTORY AND PHYSICAL EXAMINATION AND UPDATED H&P REQUIREMENTS BELOW:    [facility] and [name] Medical Staff Policy 200 - For Patients Undergoing Procedures Requiring Moderate or Deep Sedation, General Anesthesia or Regional Anesthesia    Contents of a History and Physical Examination (H&P):    The H&P shall consist of chief complaint, history of present illness, allergies and medications, relevant social and family history, past medical history, review of systems and physical examination, and assessment and plan appropriate to the patient's age.    For Patients Undergoing Procedures Requiring Moderate or Deep Sedation, General Anesthesia or Regional Anesthesia:    1. An H&P shall be performed within 24 hours prior to the procedure by a qualified member of the medical staff or designee with appropriate privileges, except as noted in item 2 below.    2. If a complete history and physical was performed within thirty (30) calendar days prior to the patient's admission to the Medical Center for elective surgery, a member of the medical staff assumes the responsibility for the accuracy of the clinical information and will need to document in the medical record within twenty-four (24) hours of admission and prior to surgery or major invasive procedure, that they either attest that the history and physical has been reviewed and accepted, or document an update of the original history and physical relevant to the patient's current  clinical status.    3. Providing an H&P for patients undergoing surgery under local anesthesia is at the discretion of the Attending Physician.     4. When a procedure is performed by a dentist, podiatrist or other practitioner who is not privileged to perform an H&P, the anesthesiologist's assessment immediately prior to the procedure will constitute the 24 hour re-assessment.The dentist, podiatrist or other practitioner who is not privileged to perform an H&P will document the history and physical relevant to the procedure.    5. If the H&P and the written informed consent for the surgery or procedure are not recorded in the patient's medical record prior to surgery, the operation shall not be performed unless the attending physician states in writing that such a delay could lead to an adverse event or irreversible damage to the patient.    6. The above requirements shall not preclude the rendering of emergency medical or surgical care to a patient in dire circumstances.

## 2019-04-16 NOTE — Brief Op Note
OPERATIVE / PROCEDURE NOTE:    Proceduralist: Davina Poke MD PhD Assistant(s): Shelbie Ammons MD, Narda Amber MD       Pre Procedure Diagnosis: Aneurysm     Post Procedure Diagnosis: same    Procedure(s) performed:   - Cerebral Angiogram  - Flow diversion and coil embolization of the Acomm aneurysm    Findings:   - Successful flow diversion and coil embolization of the Acom aneurysm         Contrast:  Estimated Blood Loss:  Minimal    Joanna Puff        Date: 04/16/2019  Time: 11:47 AM

## 2019-04-16 NOTE — Progress Notes
Neurocritical Care Team Transition of Care from OR / IR to ICU note    The patient was seen in the NeuroICU.  The patient arrived in Neuro ICU accompanied by Anesthesia and procedural team (Neurosurgery / IR). I assessed the patient and reviewed the hospital care plans with the procedural team and ICU nurses.      Patient:Karen Porter  ZOX:[id number]  Date of Service: 04/16/2019    Diagnoses:  Present on Admission:  ? Cerebral aneurysm    Patient Active Problem List   Diagnosis   ? Senile nuclear sclerosis   ? Aphakic open-angle glaucoma, right eye   ? Amblyopia   ? Open angle with borderline findings, high risk   ? Aphakia of right eye   ? Chorioretinal scar of right eye   ? Chest pain at rest   ? Chest pain radiating to arm   ? History of substance abuse   ? 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 Acute chest pain   ? Cerebral aneurysm       Admission History:  63 y.o. female L Acomm aneurysm here for flow diverting stent and coil embo.     Vital Signs & Labs:  Temp:  [36.2 ?C (97.1 ?F)-36.4 ?C (97.6 ?F)] 36.4 ?C (97.6 ?F)  Heart Rate:  [73-85] 79  Resp:  [8-19] 19  BP: (133-150)/(73-120) 150/120  NBP Mean:  [99-129] 129  SpO2:  [97 %-100 %] 100 %  Infusions:      Labs:  Lab Results   Component Value Date    NA 138 03/06/2019    K 3.5 (L) 03/06/2019    CL 100 03/06/2019    BUN 13 03/06/2019    CO2 26 03/06/2019         Neurological Exam: Glasgow Coma Scale Score:  [15] 15  Mental Status: Was normal, exceptions are noted:   Cranial Nerve II - XII assessed, and were normal, exceptions are noted:    Motor exam:  RUE  5 / 5  RLE  5 / 5  LUE  5 / 5  LLE  5 / 5      Assessment:  The main diagnosis is: Acomm aneurysm  The surgery was uncomplicated.   The patient is on a standard care pathway and expected to stay < 24 hours in the ICU.        Plans:  Specific interventions were performed:     1. The events of the OR and anesthesia were reviewed.  2. Monitoring of neurological exam by RNs 3. Hemodynamic monitoring as per ICU standard  4. Reviewed the ICU Checklist and assessed adherence to ICU Bundles.  5. Evaluation for ICU needs and criteria for transfer were done.      Discussion:  Expecting uncomplicated post-operative course  Post-op imaging as per neurosurgery  BP goal: 90-140  Antiplatelet therapy ongoing per neurosurgery  No indication for chemical DVT prophylaxis     Asa/PLavix

## 2019-04-17 LAB — Differential Automated: LYMPHOCYTE PERCENT, AUTO: 10.3 (ref 0.00–0.50)

## 2019-04-17 LAB — CBC: RED CELL DISTRIBUTION WIDTH-SD: 44.2 fL (ref 36.9–48.3)

## 2019-04-17 LAB — APTT: APTT: 30.6 s (ref 24.4–36.2)

## 2019-04-17 MED ORDER — POLYETHYLENE GLYCOL 3350 17 G PO PACK
17 g | PACK | Freq: Two times a day (BID) | ORAL | 0 refills | 14.00000 days | Status: AC | PRN
Start: 2019-04-17 — End: ?
  Filled 2019-04-18: qty 28, 14d supply, fill #0

## 2019-04-17 MED ORDER — MAGNESIUM HYDROXIDE 400 MG/5ML PO SUSP
30 mL | Freq: Every day | ORAL | 0 refills | 16.00000 days | Status: AC | PRN
Start: 2019-04-17 — End: ?

## 2019-04-17 MED ORDER — DSS 100 MG PO CAPS
100 mg | ORAL_CAPSULE | Freq: Two times a day (BID) | ORAL | 0 refills | 30.00000 days | Status: AC
Start: 2019-04-17 — End: ?

## 2019-04-17 MED ADMIN — DOCUSATE SODIUM 100 MG PO CAPS: 100 mg | ORAL | @ 17:00:00 | Stop: 2019-04-18

## 2019-04-17 MED ADMIN — DOCUSATE SODIUM 100 MG PO CAPS: 100 mg | ORAL | @ 17:00:00 | Stop: 2019-04-18 | NDC 00904645561

## 2019-04-17 MED ADMIN — METOPROLOL SUCCINATE ER 50 MG PO TB24: 50 mg | ORAL | @ 17:00:00 | Stop: 2019-04-18 | NDC 60687040211

## 2019-04-17 MED ADMIN — DOCUSATE SODIUM 50 MG/5ML PO LIQD: 100 mg | ORAL | @ 06:00:00 | Stop: 2019-05-16

## 2019-04-17 MED ADMIN — LABETALOL HCL 5 MG/ML IV SOLN: 5 mg | INTRAVENOUS | @ 06:00:00 | Stop: 2019-04-23 | NDC 72266010201

## 2019-04-17 MED ADMIN — BUSPIRONE HCL 5 MG PO TABS: 5 mg | ORAL | @ 17:00:00 | Stop: 2019-04-18

## 2019-04-17 NOTE — Other
Patients Clinical Goal:   Clinical Goal(s) for the Shift: VSS, comfort, safety, DC home today  Identify possible barriers to advancing the care plan: Adequate for Discharge  Stability of the patient: Moderately Stable - low risk of patient condition declining or worsening   End of Shift Summary:     Patient was provided AVS and educated about post-discharge care. Patient provided with medication rx.   All questions and concerns addressed at time of discharge. All invasive devices removed. Patient's family is picking up patient    Patient is adequate for discharge.     Problem: General Neurology  Goal: Baseline neurological status will be maintained or improved.  Outcome: Adequate for Discharge  Goal: Patient will not experience: neurological decline, sensory/perceptual decline, increased urine output, electrolyte imbalance.  Outcome: Adequate for Discharge  Goal: Patient will maintain stable vital signs and cardiac rhythm.  Outcome: Adequate for Discharge  Goal: Verbalize understanding of disease process, condition (current disabilities/impairments) and management  Outcome: Adequate for Discharge     Problem: Post-Interventional Neuro-Radiology Procedures (AVM/Aneurysm/Clot Retrieval)  Goal: Patient will return to baseline neurological status.  Outcome: Adequate for Discharge  Goal: Prevention or control of procedural complications.  Outcome: Adequate for Discharge  Goal: Maintain adequate cerebral perfusion.  Outcome: Adequate for Discharge  Goal: Verbalize understanding of diagnosis/condition and management.  Outcome: Adequate for Discharge     Problem: Hospital Stay/Discharge  Goal: Patient will remain free from injury during hospitalization.  Outcome: Adequate for Discharge  Goal: Patient/Family will demonstrate knowledge of safety precautions and injury protection measures.  Outcome: Adequate for Discharge  Goal: Patient will be assessed for fall risk.  Outcome: Adequate for Discharge Goal: Patient will be assessed for risk of skin breakdown.  Outcome: Adequate for Discharge  Goal: Patient/Family will be able to state the purpose, side effects, and correct dose of prescribed medications.  Outcome: Adequate for Discharge  Goal: Patient/Family will demonstrate understanding of food/drug interactions.  Outcome: Adequate for Discharge  Goal: Patient/Family will verbalize understanding of discharge plan.  Outcome: Adequate for Discharge  Goal: Patient/Family will demonstrate understanding of: home health service, community support groups, smoking cessation programs, in-home support agencies.  Outcome: Adequate for Discharge  Goal: Patient/Family will verbalize understanding of the care plan for follow-up care, when to resume normal activities, danger signs to watch for, when to call the doctor.  Outcome: Adequate for Discharge     Problem: Psychosocial  Goal: Patient will verbalize/demonstrate adequate coping strategies for illness/hospitalization.  Outcome: Adequate for Discharge  Goal: Patient will demonstrate decreased or minimal psychosocial distress.  Outcome: Adequate for Discharge  Goal: Patient will report anxiety at manageable levels.  Outcome: Adequate for Discharge  Goal: Pt/Family able to verbalize concerns and demonstrate effective coping strategies.  Outcome: Adequate for Discharge  Goal: Pt/Family able to effectively weigh alternatives and participate in decision making related to treatment and care.  Outcome: Adequate for Discharge

## 2019-04-17 NOTE — Nursing Note
[place] with MD Mariea Clonts, no labs ordered for pt. Okay unless NSurg wants labs.  [place] to clarify if any labs are needed for pt  2255- Per MD Bartolo Darter, no labs needed for pt.   0035- Report given to [place].

## 2019-04-17 NOTE — Other
Patients Clinical Goal:   Clinical Goal(s) for the Shift: SBP: 90-140, Na: 138-145     End of Shift Summary:     NEURO: A&O x 4.     CARDIO: NSR; HR 80s. SBP out of goal. PRNs given.    RESP: 2L NC.     GI: No BM.     GU: Foley    Patient post procedure - MD Khatabi rounded on patient after being notified of drsg saturated. MD held pressure from 1325 -  1415. PTT drawn; Protamine given. Left groin - stable hematoma; no further bleeding. MD Khatabi follow-up on patient.

## 2019-04-17 NOTE — Progress Notes
ICU evening progress note for ongoing issues:     The patient was seen in the ICU, is unstable and critical care is being delivered throughout the day by the 24 hour-in-house intensive care physician. The patient has complex medical problems that required critical care and complex decision making due to the inherent instability and potential for instability. See NCC Attending note from earlier today for care plan.    Time: 40 minutes critical care time was spent.    Infusions:      Temp:  [36.2 ?C (97.1 ?F)-37 ?C (98.6 ?F)] 37 ?C (98.6 ?F)  Heart Rate:  [73-92] 81  Resp:  [8-25] 18  BP: (67-174)/(41-120) 133/65  NBP Mean:  [48-129] 85  SpO2:  [97 %-100 %] 100 %    I & O's Last 24 hours totals:     Intake/Output Summary (Last 24 hours) at 04/16/2019 1837  Last data filed at 04/16/2019 1500  Gross per 24 hour   Intake 1000 ml   Output 890 ml   Net 110 ml     I&O's Last 24 hours components:No intake/output data recorded.  Labs:  Lab Results   Component Value Date    WBC 6.51 03/06/2019    HGB 11.2 (L) 03/06/2019    HCT 33.7 (L) 03/06/2019    PLT 414 (H) 03/06/2019    NA 138 03/06/2019    K 3.5 (L) 03/06/2019    CL 100 03/06/2019    BUN 13 03/06/2019    CO2 26 03/06/2019    INR 0.9 02/11/2019     Lab Results   Component Value Date    GLUCOSEPOC 81 04/16/2019         Neurological Exam: Glasgow Coma Scale Score:  [15] 15  Neuro exam was tracked throughout the day to assess       Assessment:  Patient Active Problem List   Diagnosis   ? Senile nuclear sclerosis   ? Aphakic open-angle glaucoma, right eye   ? Amblyopia   ? Open angle with borderline findings, high risk   ? Aphakia of right eye   ? Chorioretinal scar of right eye   ? Chest pain at rest   ? Chest pain radiating to arm   ? History of substance abuse   ? 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 Acute chest pain   ? Cerebral aneurysm        Plan:  Specific interventions were performed:   1. Monitored and treated the neurologic, respiratory and/or cardiac hemodynamic instability.  2. Assessed and treated the patient to improve neurologic function/reduce risk for brain injury.  3. Adjusted treatment goals frequently with bedside RNs to compensate for patient instability to mitigate risk.  4. Updated and reviewed the ICU Checklist items for the specific care plan of the day.  5. Evening bedside formal rounds were done with charge Nurse and individual bedside RNs.  6. Follow up review of interval radiology imaging and ICU lab values.  7. New orders to prevent clinical deterioration.  8. Care coordination and discussion with consultants, surgeons, and nursing.  9. Family meetings and updates about instability, prognosis, care plan and goals of care.  10. Review the ICU bundle for safety and compliance with protocols.    Discussion:            No changes to plan

## 2019-04-17 NOTE — Discharge Summary
NEUROSURGERY DISCHARGE SUMMARY      PATIENT:  Karen Porter   MRN:  [id number]  DOB:  1955-12-24    Attending Physician: Eulis Canner., MD  Primary Care Physician:  Carroll Kinds, MD    Admission Date:  04/16/2019  Discharge Date:  04/17/2019    Discharge Diagnosis: Cerebral aneurysm [I67.1]    Surgery Performed: * No procedures listed *    HPI:  Karen Porter is a 63 y.o. 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 Darci Needle on 04/16/19.     Brief Hospital Course: Karen Porter is a 63 y.o. female with Cerebral aneurysm [I67.1] who on 04/16/2019  was taken to the operating room at [facility] and underwent the above mentioned operation. Please refer to operation report for details. The patient tolerated the operation well, without complications, and was admitted for standard postoperative management and monitoring in stable conditions. The patient was transferred to 6N unit in stable condition. The patient was started on appropriate DVT prophylaxis, a bowel regimen, a regular diet, and appropriate home medications. The patient's foley catheter was discontinued and the patient was able to void without difficulty.     Physical therapy and occupational therapy evaluated the patient and recommended: Passed NERVs    By day of discharge, the patient had a stable neurologic exam, was breathing easily on room air, had return of bowel function, was tolerating a diet, and had adequate pain control with oral medication. The patient was deemed stable for discharge.    PHYSICAL EXAM:    Vital Signs   Temp:  [36.2 ?C (97.1 ?F)-37.1 ?C (98.8 ?F)] 37.1 ?C (98.8 ?F)  Heart Rate:  [73-95] 95  Resp:  [8-25] 22  BP: (67-174)/(41-120) 146/82  NBP Mean:  [48-129] 102  SpO2:  [95 %-100 %] 97 %  On RA    AAOx3  +comprehension + fluency  PERRL Pupils 3 to 2, EOMI Face symmetric. Hearing intact bilaterally. Tongue midline.  Following commands x4  Motor: 5/5 throughout, no pronator drift  Sensation grossly intact  Groin Incision site: clean, dry, intact, (-)  hematoma, erythema or drainage    Chest: symmetrical chest rise, CTA in all lobes, NSR, (-)murmur  Vascular: 2+ pulses throughout (-)edema, cyanosis  ABD: soft, nontender, flat, normoactive bowel sounds    Lines and Drains  CENTRAL LINE: None  DRAINS: None  FOLEY: None  SUTURE/STAPLE: None    ASSESSMENT / PLAN:    Neuro  s/p  flow diverting stent and coil embo for L Acomm aneurysm by Dr. Darci Needle 04/16/19  ? ASA 325mg  and Plavix 75mg  daily  ? Buspirone 5mg  BID per home medication  ? Divalproex per home medication    Pain     Expected post-operative pain  ? Tylenol PRN    Respiratory  ? Incentive spirometry    Cardiovascular  Hx HTN  ? Goal SBP 90-160  ? Keep K >4 and Mg >2  ? Home Metoprolol, Irbesartan    GU / Renal  ? Independently voiding    Endocrine  ? Glucose goal 100-140   ? Glycemic control with SSI    GI / Hepatic  ? Diet: Regular  ? Bowel regimen    Disposition: Home    Discharge Condition: Stable    Diet: Regular      Discharge Instructions: Detailed Neurosurgery discharge instructions given and discussed with patient prior to discharge.    Post-Discharge  Appointments:   The patient was asked to follow up with neurosurgery for a postoperative visit. Details, including time of postoperative visit, office location, and office phone number was given to the patient directly.          Interventional Radiology DISCHARGE INSTRUCTIONS:  Please, review the following discharge instructions. They will answer many common questions patients have after surgery.    MEDICATIONS:     Medication List      START taking these medications    docusate 100 mg capsule  Commonly known as:  Colace  Take 1 capsule (100 mg total) by mouth two (2) times daily.     magnesium hydroxide 400 mg/5 mL suspension Commonly known as:  Milk of Magnesia  Take 30 mLs by mouth daily as needed for Constipation.     polyethylene glycol powder packet  Commonly known as:  Glycolax  Mix 1 packet (17 g total) in liquid and take by mouth two (2) times daily as needed (Constipation).        CONTINUE taking these medications    aspirin 325 mg EC tablet     AVAPRO 150 mg tablet  Generic drug:  irbesartan     busPIRone 5 mg tablet  Commonly known as:  Buspar     clopidogrel 75 mg tablet  Commonly known as:  Plavix     divalproex 500 mg 24 hr tablet  Commonly known as:  Depakote ER     metoprolol succinate 50 mg 24 hr tablet  Commonly known as:  Toprol-XL           Where to Get Your Medications      These medications were sent to [facility] [phone])  [address], [place] [address]    Hours:  Mon-Fri 8AM-9PM, Sat 8AM-7PM, Sun/Holidays 8AM-5PM (Closed 1PM-2PM for lunch) Phone:  [phone]   ? docusate 100 mg capsule  ? magnesium hydroxide 400 mg/5 mL suspension  ? polyethylene glycol powder packet       Start taking the medications you took prior to surgery unless directed otherwise by your physician. Should you have questions about this please ask your physician prior to discharge.    Your doctor will provide you with prescriptions for the medication you are to take at home. You may fill your prescriptions at the [facility], or you can have them filled at a pharmacy closer to your home.    Before your discharge, your nurse will review with you and write down your medication dosage, schedule, and side effects. It is important to take your medications as ordered and try to stay on schedule.    Do NOT take ibuprofen, aspirin, coumadin, xarelto or any other anti-platelet or anti-coagulation medication unless instructed to do so by your surgeon.    If you are unsure if a medication is safe, ask your physician and/or pharmacist.    COMFORT AND PAIN MANAGEMENT It is common to have a headache and/or pain after surgery. This may last a few days or a few weeks. You will have pain medications prescribed by your doctor for your pain management. The medication may be irritating to the stomach lining, it is advisable to take it with a teaspoon of applesauce or non-fat yogurt.    Pain medication (narcotics) may cause constipation. A high-fiber diet may help  if this occurs. If your symptoms do not resolve, use a stool softener or over the counter gentle laxative. If the  medications are ineffective, call your doctor?s office to discuss on-going pain management.    If you should become constipated there are many agents that you can obtain from any pharmacy over the counter. These include:  ? For mild constipation: Colace, Senna, Milk of Magnesium, Miralax, Dulcolax tablet. ?    ? For moderate constipation: Milk of Magnesium, Dulcolax suppository ?  ? For severe constipation: Please speak to your primary care physician.     You may also experience nightmares, hallucinations and sweating from your pain medications. Please let you doctor know if these symptoms occur.    Some medications such as Percocet, Vicodin and Norco contain Tylenol (acetaminophen). Avoid taking these medications more frequently then directed; too much Tylenol can cause liver damage. Do NOT take over-the-counter products that contain Tylenol/Acetaminophen if you are already on these medications.    DO NOT drive while taking narcotics! ?    Eye/facial swelling is common after surgery and may take a few days to a week to disappear. Bruising may occur and will take one to two weeks to resolve. You may feel better if you sleep with two pillows under your head; keeping your head elevated will help reduce facial swelling.    OVERVIEW OF DAILY ACTIVITIES:    Diet  ? Please resume your regular diet prior to your procedure.    Activity ? You may return to school/work the day after your procedure if you choose to do so.  ? No heavy lifting (more than 10 pounds) or strenuous exercise for 7 days after your procedure.  ? Ok to resume light walks and light exercise the day after discharge.    OTHER ACTIVITIES: Minimize activities that require you to bend your head downward (may increase headache) such as yoga, bending forward at the waist to pick up objects. Due to seizure risk, do not swim or hike alone. Avoid straining when having a bowel movement. Take laxatives as prescribed. Recovery rates are individual, however it is a good idea to balance your activities with enough rest. It is normal to feel that you have  less energy. Some people do not feel fully back to normal for many weeks.  For the 1st week, have someone with you to do the shopping, cooking,  cleaning, etc. You should increase your activities slowly within limits set by  fatigue.     When you see your surgeon in the follow-up appointment, he or she will discuss decreasing the limits on activity at that time. You may resume sexual intimacy when you feel well enough, but do not overexert yourself. You must have clearance from your doctor before participating in any strenuous exercises/activity.    SEXUAL ACTIVITY: Sex can be resumed two weeks after your procedure.    COMFORT: It is common to have a headache after embolization, which  may last a few days or a few weeks. You will have pain medications  prescribed by your doctor for your pain management. It is important to also  take stool softeners to keep you regular when taking your pain medications.  You may also feel better if you sleep with two pillows under your head.    RESUME WORK / DRIVING / AIR TRAVEL:  You must have clearance from your doctor before returning to work, driving a car, or flying. This will be discussed at your postoperative visit. Do not drive while you are on prescription pain medications.     Wound Care ? Your wound should stay covered with  the dressing for 48 hours after the procedure.  ? After 48 hours, you may remove the original dressing and leave the wound open to the air. If you prefer, you can apply a nonadhesive bandage over the wound as well.  ? You may shower with running water and soap with the dressing on on the day of discharge.  ? Please avoid soak baths for 7 days.  ? You do not have any sutures that need removal.  ? Feeling a small lump/bump at the arterial access site is normal. Mild tenderness at the arterial access site is also normal. However, you should not experience excessive pain, expanding bruise, expanding mass, redness, drainage or bleeding at or around the arterial access site. Please call [phone] or page the on-call [facility] fellow through the [facility] page operator (pager [phone]) for after hours or weekends.    SIGNS TO WATCH FOR AT HOME:  Always try to call your doctor first if you are experiencing any of the symptoms below:  DANGER SIGNALS TO [facility] FOR AT HOME:  Call your doctors if any of these danger signals occur:  ? Onset of severe, persistent headache not relieved by medication and rest.  ? Onset of increased drowsiness, confusion or nausea and vomiting.  ? Any new onset or worsening of visual problems.  ? Any new onset or worsening of speech problems.  ? Any new onset or worsening of swallowing problems.  ? Any new onset of or increased weakness, numbness or tingling.  ? Any new onset of or increased seizures.  ? Persistent chills; new onset of fever >101F  ? Any redness, swelling, drainage, heat, or pain at the entry site on your groin.  ? Foot or toes, below the entry site, become ?cool? or appear increasingly pale.   If you develop a hematoma, or bruise, around the right groin site, please outline the site with a sharpie marker and take a picture . Please watch the site for at least 2 hours and see if it expands. If it does please call either contact number listed below - If you develop numbness, tingling, sharp pain in your either leg, please call either contact below and inform the respondee immediately.    For life-threatening emergencies that cannot wait, please go to the nearest Emergency Room for immediate evaluation or dial 911.    HOW TO REACH [place]:  We are available to you at all times.     During business hours, please call [facility]: [phone].     After 5:00 PM, please call the [facility] page operator: [phone]. Ask to have the neurosurgical resident on call contacted for urgent questions.    REHABILITATION NEEDS:  If indicated, our rehabilitation professional will assess you prior to your discharge. We will order any rehabilitation needs and equipment prior to your discharge.    FOLLOW-UP APPOINTMENTS    ? Follow up with your primary care physician in 1 week for any health maintenance needs.  ? Please call [phone] or [phone], Division of Interventional Neuroradiology, [place] to confirm follow up outpatient appointment and/or procedure/angiogram with:  ? Dr. Davina Poke    I spent >30 minutes examining the patient, preparing discharge documents including: discharge records, prescriptions, referral forms, instructions, and communicating discharge plan with the patient, family, and all relevant care providers.    The patient was seen and examined by the Neurosurgery team. Above plan was discussed with neurosurgery resident team and attending physician who were in agreement with the plan.  Author: Maceo Pro, NP  04/17/2019 6:49 AM  Department of Neurosurgery

## 2019-04-17 NOTE — Other
Patients Clinical Goal:   Clinical Goal(s) for the Shift: VSS, safety, comfort  Identify possible barriers to advancing the care plan: none  Stability of the patient: Moderately Stable - low risk of patient condition declining or worsening   End of Shift Summary: Pt presented with h/a & dizziness found to have cerebral aneurysm s/p cerebral angiogram, flow diversion, & cell embolization of Acomm aneurysm. Pt arrived to floor from 6ICU around [place]. A&Ox4, SR on monitor, RA, VSS. Foley removed around 0600, DTV. R & L groin access sites with guaze/tega dressing c/d/i. Bruising marks with no changes noted. Denies pain. Tolerating PO intake well with no c/o nausea. R arm PIV saline locked. All needs attended, call light within reach, will continue to monitor.

## 2019-04-18 MED FILL — DOCUSATE SODIUM 100 MG PO CAPS: 100 mg | ORAL | 30 days supply | Qty: 60 | Fill #0

## 2019-04-18 NOTE — Nursing Note
DCL Note:      To J[facility] @ [facility] with daughter

## 2019-04-30 ENCOUNTER — Ambulatory Visit: Payer: MEDICARE

## 2019-04-30 ENCOUNTER — Inpatient Hospital Stay
Admit: 2019-04-30 | Discharge: 2019-04-30 | Disposition: A | Discharge status: Home / Self Care | Payer: MEDICARE | Source: Home / Self Care

## 2019-04-30 DIAGNOSIS — Z9889 Other specified postprocedural states: Secondary | ICD-10-CM

## 2019-04-30 DIAGNOSIS — R519 Acute nonintractable headache, unspecified headache type: Secondary | ICD-10-CM

## 2019-04-30 LAB — CK, Total: CREATINE PHOSPHOKINASE, TOTAL: 70 U/L (ref 38–282)

## 2019-04-30 LAB — Basic Metabolic Panel
GFR ESTIMATE FOR AFRICAN AMERICAN: >89 mL/min/{1.73_m2} (ref 3.6–5.3)
TOTAL CO2: 24 mmol/L (ref 20–30)

## 2019-04-30 LAB — B-Type Natriuretic Peptide: BNP: 103 pg/mL — ABNORMAL HIGH (ref <100)

## 2019-04-30 LAB — Differential Automated: ABSOLUTE LYMPHOCYTE COUNT: 2.19 10*3/uL (ref 1.30–3.40)

## 2019-04-30 LAB — Troponin I
TROPONIN I: <0.04 ng/mL (ref <0.1)
TROPONIN I: <0.04 ng/mL (ref <0.1)

## 2019-04-30 LAB — Prothrombin Time Panel: INR: 1 s (ref 11.5–14.4)

## 2019-04-30 LAB — APTT: APTT: 30.8 s (ref 24.4–36.2)

## 2019-04-30 LAB — CBC: HEMATOCRIT: 31 — ABNORMAL LOW (ref 34.9–45.2)

## 2019-04-30 LAB — D-Dimer: D-DIMER STAGO: 0.4 ug{FEU}/mL (ref <0.60)

## 2019-04-30 MED ORDER — HYDROCODONE-ACETAMINOPHEN 5-325 MG PO TABS
1 | ORAL_TABLET | Freq: Four times a day (QID) | ORAL | 0 refills | 4.00000 days | Status: AC | PRN
Start: 2019-04-30 — End: ?

## 2019-04-30 MED ORDER — ONDANSETRON 4 MG PO TBDP
4 mg | ORAL_TABLET | Freq: Four times a day (QID) | ORAL | 0 refills | Status: AC | PRN
Start: 2019-04-30 — End: ?

## 2019-04-30 MED ADMIN — MORPHINE SULFATE (PF) 4 MG/ML IV SOLN: 4 mg | INTRAVENOUS | @ 10:00:00 | Stop: 2019-04-30 | NDC 00641612525

## 2019-04-30 MED ADMIN — IOHEXOL 350 MG/ML IV SOLN: 75 mL | INTRAVENOUS | @ 12:00:00 | Stop: 2019-04-30 | NDC 00407141490

## 2019-04-30 MED ADMIN — LORAZEPAM 2 MG/ML IJ SOLN: 1 mg | INTRAVENOUS | @ 11:00:00 | Stop: 2019-04-30 | NDC 00641604425

## 2019-04-30 MED ADMIN — ONDANSETRON HCL 4 MG/2ML IJ SOLN: 4 mg | INTRAVENOUS | @ 10:00:00 | Stop: 2019-04-30 | NDC 60505613005

## 2019-04-30 NOTE — Consults
NEUROSURGERY CONSULT NOTE     CHIEF COMPLAINT: HA + CP  ATTENDING: Dr. Terese Door    HISTORY OF PRESENT ILLNESS   [phone] with hx of L Acomm aneurysm s/p coil on 04/16/2019  with intermittent HA post procedure who presented today with new onset chest pain. Her HA have been intermittent since the procedure but the patient denies any new neurologic deficits. CT head is negative for [facility] and MRA shows no evidence of spasm or residual aneurysm.     PAST MEDICAL/SURGICAL HISTORY     Past Medical History:   Diagnosis Date   ? Amblyopia of right eye    ? Anxiety    ? 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 Depression    ? Hypertension     pt states she stopped taking bp medication 11/25 because it was under conrol with diet   ? Insomnia    ? Retinal detachment, right      Past Surgical History:   Procedure Laterality Date   ? BUNIONECTOMY     ? CATARACT EXTRACTION  1986    right eye   ? OVARIAN CYST REMOVAL         MEDICATIONS       ALLERGIES   Penicillins    FAMILY HISTORY / SOCIAL HISTORY     Family History   Problem Relation Age of Onset   ? Heart disease Mother    ? Stroke Maternal Grandfather    ? Heart disease Maternal Grandfather       reports that she has quit smoking. She smoked 0.25 packs per day. She has never used smokeless tobacco. She reports previous drug use. Drugs: Methamphetamines and Cocaine. She reports that she does not drink alcohol.    REVIEW OF SYSTEMS   A complete 14-point review of systems was conducted. Pertinent positives and negatives are noted in the HPI. Systems reviewed include constitutional symptoms, eyes, ears, nose, throat, mouth, cardiovascular, respiratory, gastrointestinal, genitourinary, musculoskeletal, integumentary (skin and breasts), neurological, psychiatric, endocrine, hematologic/lymphatic, and allergic/immunologic.     PHYSICAL EXAM   Temp:  [37 ?C (98.6 ?F)] 37 ?C (98.6 ?F)  Heart Rate:  [72-79] 74 Resp:  [16-20] 16  BP: (116-151)/(63-77) 138/67  NBP Mean:  [82] 82  SpO2:  [95 %-99 %] 95 %      AOx3  PERRL, EOMI  FS, TM  FCx4, Full strength, No drift    DATA     Recent Labs   Lab 04/30/19  0122   WBC 8.57   PLT 443*   INR 1.0   APTT 30.8   NA 140   CREAT 0.73   GLUCOSE 100*      No results for input(s): PHENYTOIN in the last 72 hours.    CT wo contrast:   No evidence of acute intracranial hemorrhage or mass effect. Status post coil embolization in the anterior cranial fossa.    MRA:  Status post coil embolization of the anterior communicating artery aneurysm. No residual aneurysm is evident. Normal flow is seen in the left A2 and A3 segments.  No significant stenosis or proximal branch occlusion.  ?    ASSESSMENT / PLAN   Karen Porter is a 63 y.o. female with hx of L Acomm aneurysm s/p coil on 04/16/2019  with intermittent HA post procedure who presented today with new onset chest pain. Her HA have been intermittent since the procedure but the patient denies any new neurologic deficits. CT  head is negative for [facility] and MRA shows no evidence of spasm or residual aneurysm.     -No urgent neurosurgical intervention  -Conservative management for HA with pain medications per ED  -Rest of CP work-up per ED  -OK to discharge patient home   -Follow-up with Dr. Darci Needle as scheduled    The patient was discussed with the resident team & attending who agree with assessment and plan listed above. Please page [phone] with any questions.      Bary Leriche  4:27 AM 04/30/2019  Department of Neurological Surgery  [phone]

## 2019-04-30 NOTE — ED Notes
Unable to dc at this time, pt remains lethargic from ativan, Dr. Cherly Hensen aware, will allow patient to wake up more prior to dc.    6:17 AM  Pt arouses to voice, unable to stand up independently at this time, VSS, will dc when safe to do so.

## 2019-04-30 NOTE — ED Provider Notes
[facility]  Emergency Department Service Report    Karen Porter   Emergency Provider Note:  04/30/2019 63 y.o. female presents with Headache (on and off since her cerebral embolization surgery at [facility] on 11/5. pain to middle center of back to chest ) and Chest Pain (''feels like my chest is caving in'')    Triage   Arrived on 04/30/2019 at 1:06 AM  Arrived by Wheelchair [4]    History   Chief Complaint:   Headache (on and off since her cerebral embolization surgery at [facility] on 11/5. pain to middle center of back to chest ) and Chest Pain (''feels like my chest is caving in'')    History of Present Illness:   Karen Porter is a 63 y.o. female who presents to the ED for evaluation of gradual onset of chest pain radiating to upper back starting a few days ago. Patient describes the chest pain as a ''crushing'' feeling that is generalized, constant, and unchanging. Patient further reports pain is worse with palpation. No SOB.  Also has had headache for past few days, worse today, diffuse over entire head.   . Denies cough, vomiting, or diarrhea. No sick contacts/ travel. No numbness/ weakness.  Currently on ASA/ plavix.      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   Dr. Darci Needle.          Past Medical History:  Past Medical History:   Diagnosis Date   ? Amblyopia of right eye    ? Anxiety    ? 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 Depression    ? Hypertension     pt states she stopped taking bp medication 11/25 because it was under conrol with diet   ? Insomnia    ? Retinal detachment, right         Past Surgical History:  Past Surgical History:   Procedure Laterality Date   ? BUNIONECTOMY     ? CATARACT EXTRACTION  1986    right eye   ? OVARIAN CYST REMOVAL         Medications:   Previous Medications    ASPIRIN 325 MG EC TABLET    Take 325 mg by mouth daily. BUSPIRONE 5 MG TABLET    Take 5 mg by mouth two (2) times daily.    CLOPIDOGREL 75 MG TABLET    Take 75 mg by mouth daily.    DIVALPROEX 500 MG 24 HR TABLET    TAKE 1 TABLET BY MOUTH TWICE A DAY    DOCUSATE 100 MG CAPSULE    Take 1 capsule (100 mg total) by mouth two (2) times daily.    IRBESARTAN (AVAPRO) 150 MG TABLET    Take 150 mg by mouth daily.    MAGNESIUM HYDROXIDE 400 MG/5 ML SUSPENSION    Take 30 mLs by mouth daily as needed for Constipation.    METOPROLOL SUCCINATE 50 MG 24 HR TABLET    TAKE 1 TABLET BY MOUTH DAILY    POLYETHYLENE GLYCOL POWDER PACKET    Mix 1 packet (17 g total) in liquid and take by mouth two (2) times daily as needed (Constipation).       Allergies:   Penicillins     Social History:   Social History     Socioeconomic History   ? Marital status: Divorced  Spouse name: Not on file   ? Number of children: Not on file   ? Years of education: Not on file   ? Highest education level: Not on file   Occupational History     Employer: NA   Social Needs   ? Financial resource strain: Not on file   ? Food insecurity     Worry: Not on file     Inability: Not on file   ? Transportation needs     Medical: Not on file     Non-medical: Not on file   Tobacco Use   ? Smoking status: Former Smoker     Packs/day: 0.25   ? Smokeless tobacco: Never Used   ? Tobacco comment: 1-3 cigs a day   Substance and Sexual Activity   ? Alcohol use: No   ? Drug use: Not Currently     Types: Methamphetamines, Cocaine     Comment: 5 years sober    ? Sexual activity: Not on file     Comment: last used last year.    Lifestyle   ? Physical activity     Days per week: Not on file     Minutes per session: Not on file   ? Stress: Not on file   Relationships   ? Social Wellsite geologist on phone: Not on file     Gets together: Not on file     Attends religious service: Not on file     Active member of club or organization: Not on file     Attends meetings of clubs or organizations: Not on file Relationship status: Not on file   Other Topics Concern   ? Do you exercise at least a day, 3 or more days a week? Not Asked   ? Types of Exercise? (List in Comments) Not Asked   ? Do you follow a special diet? Not Asked   ? Vegan? Not Asked   ? Vegetarian? Not Asked   ? Pescatarian? Not Asked   ? Lactose Free? Not Asked   ? Gluten Free? Not Asked   ? Omnivore? 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    Review of Systems:   Resp: +chest pain, -coughing  Abd: -vomiting  Neuro: +headache  General: +fatigue  Msk: +back pain  All systems reviewed, negative except as described above     Physical Exam   VITAL SIGNS: BP 151/77  ~ Pulse 75  ~ Temp 37 ?C (98.6 ?F) (Oral)  ~ Resp 16  ~ Wt 56.7 kg (125 lb)  ~ SpO2 99%  ~ BMI 22.14 kg/m?   Pulse oximetry is interpreted by myself as normal   General: No acute distress. Alert and awake.  HEENT: Normocephalic. Atraumatic. Pupils equal  Neck:  Supple Trachea is midline              Chest: Lungs clear to auscultation without crackles/ wheezes  Cardiovascular: Regular rate and rhythm without murmurs, rubs or gallops.    Abdomen: Soft, nontender, nondistended  Skin: Warm Pink and well-perfused extremities with brisk capillary refill.  Extremities: No cyanosis, clubbing or edema. Normal joint range of motion.  Neurologic: Alert &  No deficits noted. CN II-XII intact.  Upper and lower extremity  strength 5/5 bilaterally in deltoids, biceps, triceps, handgrips, hip flexion, hip extension, ankle dorsiflexion, ankle plantar flexion.  Normal sensation to light touch in bilateral upper and lower extremeties. Normal gait.      RECORDS REVIEWED:   I have reviewed patient's available medical record, nursing notes and information.    Laboratory Results   Labs:  Labs Reviewed BNP - Abnormal; Notable for the following components:       Result Value    BNP 103 (*)     All other components within normal limits   BASIC METABOLIC PANEL - Abnormal; Notable for the following components:    Glucose 100 (*)     All other components within normal limits   CBC (PERFORMABLE) - Abnormal; Notable for the following components:    Red Blood Cell Count 3.59 (*)     Hemoglobin 9.6 (*)     Hematocrit 31.0 (*)     MCH Concentration 31.0 (*)     Platelet Count, Auto 443 (*)     Mean Platelet Volume 8.7 (*)     All other components within normal limits   DIFFERENTIAL, AUTOMATED (PERFORMABLE) - Abnormal; Notable for the following components:    Absolute Immature Gran Count 0.06 (*)     All other components within normal limits   PROTHROMBIN TIME PANEL - Normal   APTT - Normal   CK, TOTAL - Normal   D-DIMER - Normal    Narrative:     *ALERT:New Reference Range (<0.6 ug/mL FEU)*  *ALERT:New Reportable Units (ug/mL FEU)*  *ALERT:New Cut-off Values for DVT/PE    Note: Quantitative method to detect D-Dimer.    Cut-off value to rule out DVT/PE in patients with low/moderate pretest probability: <0.50 ug/mL FEU   CBC & AUTO DIFFERENTIAL    Narrative:     The following orders were created for panel order CBC & Plt & Diff.  Procedure                               Abnormality         Status                     ---------                               -----------         ------                     ZOX[[id number]]                          Abnormal            Final result               Differential, Automated[[id number]]      Abnormal            Final result                 Please view results for these tests on the individual orders.   TROPONIN I         Imaging Results   ECG: 12-lead ECG Interpretation : The patient's electrocardiogram shows following a sinus rhythm with a rate of 69 normal intervals and QRS segments. There are no significant ST segment or T-wave changes suggestive of  ischemia or acute infarction. By my interpretation at the time of patient care.          Radiology:  CT chest angiogram w contrast   Preliminary Result by Truett Mainland., MD (11/19 0358)   IMPRESSION:      1.  No evidence of pulmonary embolism.   2.  Emphysema. Bilateral groundglass opacities more predominant in the dependent portions of the lung may represent atelectasis, although a component of desquamative interstitial pneumonia may also be present.   3.  Few incidental thyroid nodules are noted. According to the American Thyroid Association guidelines, given the size of these nodules and age of the patient, no further imaging is necessary.      THIS IS A PRELIMINARY REPORT THAT HAS NOT BEEN REVIEWED BY AN ATTENDING RADIOLOGIST.      Dictated by: Ivor Costa   04/30/2019 3:58 AM      CT brain wo contrast   Preliminary Result by Truett Mainland., MD (11/19 0343)   IMPRESSION:      No evidence of acute intracranial hemorrhage or mass effect. Status post coil embolization of anterior communicating artery.      THIS IS A PRELIMINARY REPORT THAT HAS NOT BEEN REVIEWED BY AN ATTENDING RADIOLOGIST.      Dictated by: Ivor Costa   04/30/2019 3:43 AM      MR brain angiogram wo contrast   Preliminary Result by Truett Mainland., MD (11/19 0339)   IMPRESSION:      Status post coil embolization of the anterior communicating artery aneurysm, with no residual flow into the aneurysm.      THIS IS A PRELIMINARY REPORT THAT HAS NOT BEEN REVIEWED BY AN ATTENDING RADIOLOGIST.      Dictated by: Ivor Costa   04/30/2019 3:39 AM      XR chest ap portable (1 view)   Final Result by Blima Ledger B., DO (11/19 0341)   IMPRESSION:        No acute cardiopulmonary disease.      Dictated by: Ivor Costa   04/30/2019 2:28 AM I, Blima Ledger, D.O., have personally reviewed this radiological study and agree with the report above.        Signed by: Blima Ledger   04/30/2019 3:41 AM            Administered Medications     Medication Administration from 04/30/2019 0106 to 04/30/2019 2130       Date/Time Order Dose Route Action Action by Comments     04/30/2019 0133 morphine PF 4 mg/mL inj 4 mg 4 mg IV Push Given Joya Gaskins, RN      04/30/2019 0133 ondansetron 4 mg/2 mL inj 4 mg 4 mg IV Push Given Joya Gaskins, RN      04/30/2019 0239 LORazepam 2 mg/mL inj 1 mg 1 mg IV Push Given Joya Gaskins, RN      04/30/2019 0345 iohexol (Omnipaque) 350 mg/mL inj 75 mL 75 mL Intravenous Given Matheu, Santo Held 3.8cc/s            Procedures   Observation time:  Patient has no family history  that is relevant to this complaint.     Patient first seen at 01:12.     Observation began at 01:17  and was necessary in order to determine improvement and to perform serial exams, imaging, labs, consultation, analgesics.Marland Kitchen Upon   re-evaluation, observation revealed that the patient should be discharged  Medically cleared for discharge at 05:30  Total time of observation: 3+ hours.      ED Course and Medical Decision Making Karen Porter is a 63 y.o. female who presents with headache, chest pain, back pain.  CBC was ordered to evaluate leukocytosis, leukopenia, presence of anemia, and quantitative evaluation of platelets. BMP was ordered to evaluate electrolyte abnormality such as hypo or hyper natremia, renal function, blood glucose level, and degree of acidosis or alkalosis. LFTs ordered to evaluate liver function, lipase, to check pancreatitis. Coags sent to evaluate coagulopathy.  Afebrile, No leukocytosis. WBC 8.5.   No significant anemia requiring transfusion.  Hb 9.6. Similar to prior.   No significant hyperkalemia/ hypokalemia.  No renal failure/ hepatic failure.  Doubt ACS/ CVA. Trop neg.    No significant hypoglycemia/ hyperglycemia. Glu 100.  CT brain neg for ICH/ mass effect.   CTA chest shows no PE.  Has emphysema.  Bilateral groundglass opacities , possible atelectasis vs pneumonia.  Has thyroid nodules.   MRA brain shows s/p emoblization with no residual flow into aneurysm.   IV morphine/ zofran.  Neurosurgery consulted.  Recommends CT brain and MRA brain. Patient requests ativan for anxiety.  Ativan given.  Serial troponins negative. Doubt ACS.    Neurosurgery feels patient safe to go home and has written note.  Will follow up PCP and outpatient neurosurgery.  Discharge home.  Pain now resolved in chest/ brain.  Patient and family agree w/ plan.    1:43 AM Consult with neurosurgery. Spoke to Dr. Bary Leriche about the patient?s presenting symptoms and exam findings.     The patient was evaluated in the context of a global pandemic, which necessitated consideration that the patient may be at risk for infection with COVID-19. Institutional protocols that pertain to patients at risk for COVID-19 are in a state of rapid change based on the CDC along with federal and state regulatory bodies. These policies and algorithms were followed and met standards of care during the patients care in the emergency department. Clinical Impression     1. Acute chest pain    2. Acute nonintractable headache, unspecified headache type          Disposition and Follow-up   Disposition: Patient was discharged in good condition.          Prescriptions     New Prescriptions    No medications on file       All scribe entries and documentation made by the scribe were entered at my direction.  I have reviewed this medical record and agree to the accuracy and completeness of the content entered by the scribe.  The documentation recorded by the scribe accurately reflects the service I personally performed and the decisions made by me.    The documentation on this chart was performed by Drema Balzarine, scribed for Sophronia Simas., MD    04/30/2019 1:25 AM      Sophronia Simas., MD  04/30/19 [phone]

## 2019-04-30 NOTE — ED Notes
Pt to MRI

## 2019-05-18 ENCOUNTER — Ambulatory Visit: Payer: MEDICARE

## 2019-05-28 MED ORDER — CLOPIDOGREL BISULFATE 75 MG PO TABS
ORAL_TABLET | 1 refills | Status: AC
Start: 2019-05-28 — End: ?

## 2019-06-19 ENCOUNTER — Ambulatory Visit: Payer: MEDICARE

## 2019-06-19 NOTE — Progress Notes
INTERVENTIONAL NEURORADIOLOGY  PROGRESS NOTE    PATIENT NAME: Karen Porter     DOB: 04-21-1956     MR#: [id number]     DATE: 06/19/2019     REFERRING PHYSICIAN: Tamsen Meek., MD     HISTORY OF PRESENT ILLNESS   Karen Porter is a 64 y.o. female who presents with amblyopia of right eye, anxiety, depression, HTN, insomnia, right  detachment, and polysubstance abuse, used cocaine and meth (~5 years sober). While being worked up for a code stroke, CT head incidentally found to have a small anterior comm aneurysm measuring 3 mm, now s/p coil embolization with flow diversion on 04/16/19. She presents today for clinic follow up and surveillance.     In review, the patient was reportedly listening to music, sitting, noticed acute onset of dizziness with associated headache and blurry vision. She attempted to stand, dizziness continued but did not worsen.?Described hearing a 'buzzing noise', with left ear pain. She felt very nauseous and had an episode of emesis. This prompted her to seek medical attention, she was ruled out for a stroke, but was incidentally found to have 3 mm ACOMM aneurysm. She underwent successful flow diversion of the left A1-A2 with decrease flow across the anterior communicating artery as well as coil embolization of the anterior communicating artery aneurysm on 04/16/19 and was discharged on DAPT, ASA 325 mg and Plavix 75 mg daily.      In regards to her high blood pressure, she takes metoprolol 50 mg daily. Denies checking her blood pressure daily. Reports to follow up with her PCP regularly who manages her anti-hypertensive medication.     MEDICATIONS:   No outpatient medications have been marked as taking for the 06/19/19 encounter (Appointment) with Davina Poke, MD.       ALLERGIES:   Allergies   Allergen Reactions   ? Penicillins Rash       PHYSICAL EXAMINATION:    Vital Signs:There were no vitals taken for this visit.    GEN:  Well developed, well nourished, in no acute distress. SKIN:  PWD, no rashes noted.    HEENT:  Normocephalic.  Sclera anicteric.   EXT:   without clubbing, cyanosis or edema.  NEURO:    Mental Status:   General: Awake, alert and oriented to person, place and time   Concentration and attention span: Normal   Fund of knowledge: Adequate recent and remote recall   Language: Fluent and articulate without evidence of aphasia or dysarthria  Cranial Nerves:   II: PERRL, visual fields full to confrontation   III, IV, VI: Gaze conjugate, EOMi, no nystagmus   V: Sensation intact and symmetric to light touch V1-V3   VII: Symmetric facial motor function bilaterally   VIII: Intact to finger rub bilaterally   IX, X: Palate elevates symmetrically   XI: Normal shrug bilaterally   XII: Tongue protrudes midline  The corneal reflexes were not tested  Motor:   5/5 muscle strength against resistance throughout.    Normal bulk and tone.   No fasciculations, myotonia or other abnormal movements.  Sensation:   Light touch: Intact and symmetric in the bilateral upper and lower extremities.  DTRs:    2+ patellar bilaterally  Coordination:   Rapid alternating movements are normal, no dysdiadochokinesia  Gait:   Normal casual gait without ataxia.     Normal arm swing and step size.    Labs:  Lab Results   Component Value Date    NA 140 04/30/2019  K 3.9 04/30/2019    CL 100 04/30/2019    CO2 24 04/30/2019    GLUCOSE 100 (H) 04/30/2019    CREAT 0.73 04/30/2019    BUN 17 04/30/2019    CALCIUM 9.1 04/30/2019     Lab Results   Component Value Date    ALT 8 01/22/2019    AST 11 (L) 01/22/2019    ALKPHOS 90 01/22/2019    BILITOT 0.3 01/22/2019     Lab Results   Component Value Date    WBC 8.57 04/30/2019    HGB 9.6 (L) 04/30/2019    HCT 31.0 (L) 04/30/2019    MCV 86.4 04/30/2019    PLT 443 (H) 04/30/2019       IMAGING STUDIES:  04/30/2019 MRA brain   IMPRESSION: Status post coil embolization of the anterior communicating artery aneurysm. No residual aneurysm is evident. Normal flow is seen in the left A2 and A3 segments.  No significant stenosis or proximal branch occlusion.    04/16/2019 IR cerebral embolization  IMPRESSION:   Successful flow diversion of the left A1-A2 with decrease flow across the anterior communicating artery as well as coil embolization of the anterior communicating artery aneurysm with minimal residual contrast filling at the neck and persistently patent   medial callosal artery originating from the anterior communicating artery budding the aneurysm neck.         ASSESSMENT/DISCUSSION: Karen Porter is a 64 y.o. patient with a history of ***.        Today's imaging study demonstrates ***.      I have discussed the findings with the patient and recommended   .  The patient agrees with this plan.  The risks, benefits, side effects, and alternatives of therapy were discussed with the patient, who verbalizes understanding. I spent 50% of my 60 minutes face-to-face with the patient discussing the examination, therapy, epidemiology, history, and natural course of the patient's condition. The patient was again educated about the signs and symptoms of stroke and brain hemorrhage and the need to call immediately 911 in that event. The patient verbalized understanding, and is to return to our clinic according to our discussion after the new imaging or sooner with any new neurological symptoms.      PLAN:    1.

## 2019-06-25 ENCOUNTER — Ambulatory Visit: Payer: MEDICARE

## 2019-06-25 MED ORDER — CLOPIDOGREL BISULFATE 75 MG PO TABS
75 mg | ORAL_TABLET | Freq: Every day | ORAL | 0 refills | Status: AC
Start: 2019-06-25 — End: ?

## 2019-06-25 MED ORDER — CLOPIDOGREL BISULFATE 75 MG PO TABS
ORAL_TABLET | 1 refills | Status: AC
Start: 2019-06-25 — End: ?

## 2019-07-15 ENCOUNTER — Ambulatory Visit: Payer: MEDICARE

## 2019-07-15 ENCOUNTER — Inpatient Hospital Stay
Admit: 2019-07-15 | Discharge: 2019-07-15 | Disposition: A | Discharge status: Home / Self Care | Payer: MEDICARE | Source: Home / Self Care

## 2019-07-15 ENCOUNTER — Telehealth: Payer: MEDICARE

## 2019-07-15 DIAGNOSIS — S9032XA Contusion of left foot, initial encounter: Secondary | ICD-10-CM

## 2019-07-15 MED ORDER — HYDROCODONE-ACETAMINOPHEN 5-325 MG PO TABS
1 | ORAL_TABLET | Freq: Four times a day (QID) | ORAL | 0 refills | 4.00000 days | Status: AC | PRN
Start: 2019-07-15 — End: ?

## 2019-07-15 MED ADMIN — HYDROCODONE-ACETAMINOPHEN 5-325 MG PO TABS: 1 | ORAL | @ 15:00:00 | Stop: 2019-07-15 | NDC 00406012362

## 2019-07-15 NOTE — ED Notes
Report received from  Daniel RN

## 2019-07-15 NOTE — ED Notes
Report given to Joanna RN.

## 2019-07-15 NOTE — ED Notes
COLLECTIVE?NOTIFICATION?07/15/2019 05:55?Shro[place], Karen Porter?MRN: [id number]    [name] patient encounter information:   ONG:[id number]  Account [id number]  Billing Account [id number]      Criteria Met      6 Visits in 180 Days    Security and Safety  No recent Security Events currently on file    ED Care Guidelines  There are currently no ED Care Guidelines for this patient. Please check your facility's medical records system.            E.D. Visit Count (12 mo.)  Facility Visits   [name] 5   [facility]  1   Total 6   Note: Visits indicate total known visits.     Recent Emergency Department Visit Summary  Date Facility [facility] Type Diagnoses or Chief Complaint   Jul 15, 2019 [facility] [place] [facility]     Apr 30, 2019 [facility] [place] [facility]      1. Chest pain, unspecified      2. Headache, unspecified      2. Chest Pain      3. Other specified postprocedural states      4. Headache      Mar 27, 2019 [place] [facility] [place] [facility]      Presence of spectacles and contact lenses      Exposure to other specified factors, initial encounter      Unspecified place or not applicable      Essential (primary) hypertension      Injury of conjunctiva and corneal abrasion without foreign body, left eye, initial encounter      Unspecified injury of left eye and orbit, initial encounter      Mar 06, 2019 [facility] [place] [facility]      1. Migraine, unspecified, not intractable, with status migrainosus      2. Hypovolemia      3. Other specified counseling      4. Headache      Feb 11, 2019 [facility] [place] [facility]      1. Dizziness      1. Syncope and collapse      2. Dizziness and giddiness      3. Benign paroxysmal vertigo, unspecified ear      Jan 22, 2019 [facility] [place] [facility]      1. Bipolar disorder, currently in remission, most recent episode unspecified 1. Benign paroxysmal vertigo, unspecified ear      3. Chest pain, unspecified      3. Other psychoactive substance abuse, in remission      5. Dizziness          Recent Inpatient Visit Summary  Date Facility [facility] Type Diagnoses or Chief Complaint   Apr 16, 2019 [name]      1. Cerebral aneurysm, nonruptured          Care Team  [facility] Phone Fax Service Dates   [facility] Primary [facility]   Current    [facility] Primary Care   Current      Collective Portal  This patient has registered at the [facility] Emergency Department   For more information visit: [email or web address]   PLEASE NOTE:     1.   Any care recommendations and other clinical information are provided as guidelines or for historical purposes only, and providers should exercise  their own clinical judgment when providing care.    2.   You may only use this information for purposes of treatment, payment or health care operations activities, and subject to the limitations of applicable [employer].    3.   You should consult directly with the organization that provided a care guideline or other clinical history with any questions about additional information or accuracy or completeness of information provided.    ? 2021 [employer] - www.[employer]medical.com

## 2019-07-15 NOTE — ED Notes
64 y/o F p/w L foot pain     Pt c/o L foot pain that started today. Pain is ''burning/strong'', ''10/10'', non-radiating. Skin warm, dry, intact, L foot +erythema/redness noted to the dorsal region, denies numbness/tingling. Pending XR.

## 2019-07-15 NOTE — Progress Notes
INTERVENTIONAL NEURORADIOLOGY  TELEMEDICINE PROGRESS NOTE    PATIENT NAME: Karen Porter     DOB: 1955/07/17     MR#: [id number]     DATE: 07/15/2019     REFERRING PHYSICIAN: Carroll Kinds, MD MD    HISTORY OF PRESENT ILLNESS   Karen Porter is a 64 y.o. 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      Patient arrived to [facility] on 04/30/19 due headache, back pain and chest pain.  MRA brain shows s/p emoblization with no residual flow into aneurysm. CTA chest shows no PE. CT brain neg for ICH/ mass effect. Patient discharged home, and symptoms resolved at time of discharge. Of note, patient arrived to [facility] ER today due to LLE pain and swelling s/p fall. XR foot revealed no acute fracture or dislocation, bone mineral density is mildly decreased, and joint spaces are intact.     Patient reports chronic headaches which occur at the top of the head, 1-2x/week, and described as ''pressure'' without identifiable triggers. She takes OTC Excedrin as needed with mild relief.  Currently denies?weakness,?numbness, tingling, or slurred speech. She also denies any history of stroke, seizure or SAH. No family history?of?cerebral aneurysms.      MEDICATIONS:     Current Outpatient Medications:   ?  aspirin 325 mg EC tablet, Take 325 mg by mouth daily., Disp: , Rfl:   ?  busPIRone 5 mg tablet, Take 5 mg by mouth two (2) times daily., Disp: , Rfl:   ?  clopidogrel (PLAVIX) 75 mg tablet, Take 1 tablet (75 mg total) by mouth daily., Disp: 120 tablet, Rfl: 0  ?  CLOPIDOGREL 75 mg tablet, TAKE 1 TABLET BY MOUTH EVERY DAY, Disp: 30 tablet, Rfl: 1 ?  divalproex 500 mg 24 hr tablet, TAKE 1 TABLET BY MOUTH TWICE A DAY, Disp: , Rfl:   ?  docusate 100 mg capsule, Take 1 capsule (100 mg total) by mouth two (2) times daily., Disp: 60 capsule, Rfl: 0  ?  HYDROcodone-acetaminophen 5-325 mg tablet, Take 1 tablet by mouth every six (6) hours as needed for Moderate Pain (Pain Scale 4-6). Max Daily Amount: 4 tablets, Disp: 10 tablet, Rfl: 0  ?  HYDROcodone-acetaminophen 5-325 mg tablet, Take 1 tablet by mouth every six (6) hours as needed for Moderate Pain (Pain Scale 4-6). Max Daily Amount: 4 tablets, Disp: 10 tablet, Rfl: 0  ?  irbesartan (AVAPRO) 150 mg tablet, Take 150 mg by mouth daily., Disp: , Rfl:   ?  magnesium hydroxide 400 mg/5 mL suspension, Take 30 mLs by mouth daily as needed for Constipation., Disp: 500 mL, Rfl: 0  ?  metoprolol succinate 50 mg 24 hr tablet, TAKE 1 TABLET BY MOUTH DAILY, Disp: , Rfl:   ?  ondansetron ODT 4 mg disintegrating tablet, Take 1 tablet (4 mg total) by mouth every six (6) hours as needed for Nausea or Vomiting., Disp: 12 tablet, Rfl: 0  ?  polyethylene glycol powder packet, Mix 1 packet (17 g total) in liquid and take by mouth two (2) times daily as needed (Constipation)., Disp: 30 packet, Rfl: 0      ALLERGIES:   Allergies   Allergen Reactions   ? Penicillins Rash       PHYSICAL EXAMINATION:  Vital Signs:Deferred  GEN:  Well developed, well nourished, in no acute distress.    NEURO:  Grossly nonfocal.    Labs:  N/A     IMAGING STUDIES:      CT BRAIN WO CONTRAST: 04/30/19    IMPRESSION:  ?  No evidence of acute intracranial hemorrhage or mass effect. Status post coil embolization in the anterior cranial fossa.  ?  MR BRAIN ANGIOGRAM WO CONTRAST: 04/30/19    IMPRESSION:  ?  Status post coil embolization of the anterior communicating artery aneurysm. No residual aneurysm is evident. Normal flow is seen in the left A2 and A3 segments.  No significant stenosis or proximal branch occlusion. ASSESSMENT/DISCUSSION: Karen Porter is a 64 y.o. patient with a history of ***.    Today's imaging study demonstrates ***.  I have discussed the findings with the patient and recommended   .  The patient agrees with this plan.  The risks, benefits, side effects, and alternatives of therapy were discussed with the patient, who verbalizes understanding. I spent 50% of my 60 minutes face-to-face with the patient discussing the examination, therapy, epidemiology, history, and natural course of the patient's condition. The patient was again educated about the signs and symptoms of stroke and brain hemorrhage and the need to call immediately 911 in that event. The patient verbalized understanding, and is to return to our clinic according to our discussion after the new imaging or sooner with any new neurological symptoms.      PLAN:    1.     AUTHOREra Skeen, NP 07/15/2019    I, ***, MD, personally reviewed the history and examined the patient.  I agree with the findings and the assessment and plan of care as documented in this note.    Patient Consent to Telehealth Questionnaire   [facility] TELEHEALTH PRECHECKIN QUESTIONS 07/13/2019   By clicking ''I Agree'', I consent to the below:  I Agree     - I agree  to be treated via a video visit and acknowledge that I may be liable for any relevant copays or coinsurance depending on my insurance plan.  - I understand that this video visit is offered for my convenience and I am able to cancel and reschedule for an in-person appointment if I desire.  - I also acknowledge that sensitive medical information may be discussed during this video visit appointment and that it is my responsibility to locate myself in a location that ensures privacy to my own level of comfort. - I also acknowledge that I should not be participating in a video visit in a way that could cause danger to myself or to those around me (such as driving or walking).  If my provider is concerned about my safety, I understand that they have the right to terminate the visit.

## 2019-07-15 NOTE — ED Provider Notes
[facility]  Emergency Department Service Report    Karen Porter 64 y.o. female , presents with Arm Pain and Leg Swelling      Triage   Arrived on 07/15/2019 at 5:55 AM   Arrived by Walk-in [14]    ED Triage Vitals [07/15/19 0602]   Temp Temp src BP Heart Rate Resp SpO2 O2 Device Pain Score Weight   -- -- 152/84 95 18 99 % None (Room air) -- 56 kg (123 lb 7.3 oz)       Pre hospital care:       Allergies   Allergen Reactions   ? Penicillins Rash       History   Karen Porter is a 64 y.o. female who presents to the ED for evaluation of pain in her left foot since a slip and fall in the shower 5 days ago ago.  She is on plavix for a prior brain aneurysm.  Denies head injury or headache.  Reports bruises left hip and leg but is able to bear weight.  Pain in the left food has been persistent, moderate and constant, unrelieved by tylenol.                   Past Medical History:   Diagnosis Date   ? Amblyopia of right eye    ? Anxiety    ? 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 Depression    ? Hypertension     pt states she stopped taking bp medication 11/25 because it was under conrol with diet   ? Insomnia    ? Retinal detachment, right         Past Surgical History:   Procedure Laterality Date   ? BUNIONECTOMY     ? CATARACT EXTRACTION  1986    right eye   ? OVARIAN CYST REMOVAL          Past Family History   family history includes Heart disease in her maternal grandfather and mother; Stroke in her maternal grandfather.                 Past Social History   she reports that she has quit smoking. She smoked 0.25 packs per day. She has never used smokeless tobacco. She reports previous drug use. Drugs: Methamphetamines and Cocaine. She reports previously being sexually active. She reports that she does not drink alcohol.     Review of Systems   Respiratory: Negative for shortness of breath.    Cardiovascular: Negative for chest pain.   Genitourinary: Negative for hematuria. Musculoskeletal: Positive for back pain.   Skin: Positive for rash (bruises) and wound.   Neurological: Negative for headaches.   Hematological: Bruises/bleeds easily.       Physical Exam   Physical Exam  Vitals signs and nursing note reviewed.   Constitutional:       Appearance: Normal appearance. She is well-developed.   HENT:      Head: Normocephalic and atraumatic.   Eyes:      Conjunctiva/sclera: Conjunctivae normal.   Neck:      Musculoskeletal: Neck supple.   Cardiovascular:      Rate and Rhythm: Normal rate.   Pulmonary:      Effort: Pulmonary effort is normal.   Chest:      Chest wall: No tenderness.   Abdominal:      General: There is no distension.   Musculoskeletal: Normal range of  motion.         General: Swelling and tenderness present.      Comments: Tender distal left foot with with mild erythema, NVI distally   Skin:     General: Skin is warm and dry.      Comments: Purplish bruises left knee, left hip but with full ROM   Neurological:      General: No focal deficit present.      Mental Status: She is alert and oriented to person, place, and time.   Psychiatric:         Mood and Affect: Mood normal.         Behavior: Behavior normal.         ED Course          Laboratory Results   Labs Reviewed - No data to display    Imaging Results     No orders to display       Administered Medications     Medication Administration from 07/15/2019 0555 to 07/15/2019 2130     None          Procedures   Procedures    MDM    Karman Gall presented with left foot pain  ED Course and plan of care: xray shows***.  Given norco for pain.        Clinical Impression   No diagnosis found.    Prescriptions     New Prescriptions    No medications on file       Disposition and Follow-up   Disposition:     Future Appointments   Date Time Provider Department Center   07/15/2019  2:00 PM Davina Poke, MD [facility] INTERVENTION       Follow up with:  No follow-up provider specified. Return precautions are specified on After Visit Summary.

## 2019-07-17 ENCOUNTER — Ambulatory Visit: Payer: MEDICARE

## 2019-07-17 DIAGNOSIS — I671 Cerebral aneurysm, nonruptured: Secondary | ICD-10-CM

## 2019-07-28 ENCOUNTER — Ambulatory Visit: Payer: PRIVATE HEALTH INSURANCE

## 2019-07-28 DIAGNOSIS — Z23 Encounter for immunization: Secondary | ICD-10-CM

## 2019-09-16 ENCOUNTER — Inpatient Hospital Stay
Admit: 2019-09-16 | Discharge: 2019-09-16 | Disposition: A | Discharge status: Home / Self Care | Payer: MEDICARE | Source: Home / Self Care

## 2019-09-16 DIAGNOSIS — H66002 Acute suppurative otitis media without spontaneous rupture of ear drum, left ear: Secondary | ICD-10-CM

## 2019-09-16 DIAGNOSIS — H6121 Impacted cerumen, right ear: Secondary | ICD-10-CM

## 2019-09-16 DIAGNOSIS — R519 Acute nonintractable headache, unspecified headache type: Secondary | ICD-10-CM

## 2019-09-16 MED ORDER — HYDROCODONE-ACETAMINOPHEN 5-325 MG PO TABS
1-2 | ORAL_TABLET | Freq: Four times a day (QID) | ORAL | 0 refills | Status: AC | PRN
Start: 2019-09-16 — End: ?

## 2019-09-16 MED ORDER — AZITHROMYCIN 250 MG PO TABS
ORAL_TABLET | 0 refills | Status: AC
Start: 2019-09-16 — End: ?

## 2019-09-16 MED ADMIN — HYDROCODONE-ACETAMINOPHEN 5-325 MG PO TABS: 2 | ORAL | @ 13:00:00 | Stop: 2019-09-16 | NDC 00406012362

## 2019-09-16 NOTE — ED Notes
Patient DC home awake, alert oriented not in distress; ambulatory; right ear irrigated per MD order. Patient's friend drove patient home. Discharge instructions given; verbalized understanding; prescriptions given to the patient.

## 2019-09-16 NOTE — ED Provider Notes
[facility]  Emergency Department Service Report    Karen Porter 64 y.o. female , presents with Otalgia      Triage   Arrived on 09/16/2019 at 5:06 AM   Arrived by Walk-in [14]    ED Triage Vitals   Temp Temp Source BP Heart Rate Resp SpO2 O2 Device Pain Score Weight   09/16/19 0508 09/16/19 0508 09/16/19 0508 09/16/19 0508 09/16/19 0508 09/16/19 0508 09/16/19 0508 09/16/19 0508 09/16/19 0509   36.6 ?C (97.8 ?F) Oral 122/70 85 16 99 % None (Room air) Ten 52.2 kg (115 lb)       Pre hospital care:       Allergies   Allergen Reactions   ? Penicillins Rash       History   The history is provided by the patient.   Otalgia  This is a new problem. The current episode started yesterday. There is pain in both ears. The problem occurs constantly. The problem has not changed since onset.The pain is mild. Associated symptoms include headaches. Pertinent negatives include no ear discharge.            Past Medical History:   Diagnosis Date   ? Amblyopia of right eye    ? Anxiety    ? 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 Depression    ? Hypertension     pt states she stopped taking bp medication 11/25 because it was under conrol with diet   ? Insomnia    ? Retinal detachment, right         Past Surgical History:   Procedure Laterality Date   ? BUNIONECTOMY     ? CATARACT EXTRACTION  1986    right eye   ? OVARIAN CYST REMOVAL          Past Family History   family history includes Heart disease in her maternal grandfather and mother; Stroke in her maternal grandfather.                 Past Social History   she reports that she has quit smoking. She smoked 0.25 packs per day. She has never used smokeless tobacco. She reports previous drug use. Drugs: Methamphetamines and Cocaine. She reports previously being sexually active. She reports that she does not drink alcohol.     Review of Systems   HENT: Positive for ear pain. Negative for ear discharge.    Neurological: Positive for headaches. All other systems reviewed and are negative.      Physical Exam   Physical Exam  Vitals signs and nursing note reviewed.   Constitutional:       Appearance: Normal appearance. She is normal weight.   HENT:      Head: Normocephalic and atraumatic.      Right Ear: Ear canal and external ear normal. There is impacted cerumen.      Left Ear: Ear canal and external ear normal.      Ears:      Comments: Injected left TM     Nose: Nose normal.      Mouth/Throat:      Mouth: Mucous membranes are moist.      Pharynx: Oropharynx is clear.   Eyes:      Extraocular Movements: Extraocular movements intact.      Conjunctiva/sclera: Conjunctivae normal.      Pupils: Pupils are equal, round, and reactive to light.   Neck:  Musculoskeletal: Normal range of motion and neck supple.   Cardiovascular:      Rate and Rhythm: Normal rate and regular rhythm.      Pulses: Normal pulses.      Heart sounds: Normal heart sounds.   Pulmonary:      Effort: Pulmonary effort is normal.      Breath sounds: Normal breath sounds.   Abdominal:      General: Abdomen is flat. Bowel sounds are normal.      Palpations: Abdomen is soft.   Musculoskeletal: Normal range of motion.   Skin:     General: Skin is warm.      Capillary Refill: Capillary refill takes less than 2 seconds.   Neurological:      General: No focal deficit present.      Mental Status: She is alert and oriented to person, place, and time. Mental status is at baseline.   Psychiatric:         Mood and Affect: Mood normal.         Behavior: Behavior normal.         Thought Content: Thought content normal.         Judgment: Judgment normal.         ED Course          Laboratory Results   Labs Reviewed - No data to display    Imaging Results     No orders to display       Administered Medications     Medication Administration from 09/16/2019 0506 to 09/16/2019 0541       Date/Time Order Dose Route Action Action by Comments     09/16/2019 0532 HYDROcodone-acetaminophen 5-325 mg tab 2 tablet 2 tablet Oral Given Lucia Bitter, RN           Procedures   Ear Wax Removal    Date/Time: 09/16/2019 5:43 AM  Performed by: Pablo Ledger, MD  Authorized by: Pablo Ledger, MD   Consent: Verbal consent obtained. Written consent not obtained.  Patient identity confirmed: verbally with patient  Time out: Immediately prior to procedure a ''time out'' was called to verify the correct patient, procedure, equipment, support staff and site/side marked as required.    Anesthesia:  Local Anesthetic: none  Location details: right ear  Patient tolerance: patient tolerated the procedure well with no immediate complications  Procedure type: irrigation   Sedation:  Patient sedated: no            MDM  Data Reviewed/Counseling: I have reviewed the patient's vital signs and nursing notes. I had a detailed discussion with the patient regarding the historical points, exam findings, and any diagnostic results supporting the discharge diagnosis. I also discussed the need for outpatient follow-up and the need to return to the ED if symptoms worsen or if there are any questions or concerns that arise at home.    Patient presents for gradual ear pain, right greater than left, with new headache; no thunderclap component; afebrile; not toxic; left TM injected c/w ASOM; right ear canal with cerumen impaction likely causing ear pain; ear irrigated; norco given for pain control; I considered doing a CT brain on this patient but given [place] of significant headache and normal neurological exam with no acute focal deficits, I feel it is reasonable to defer at this time.  Patient is feeling better post ear wax removal and norco; Patient is otherwise well appearing; feel it is reasonable to discharge the patient home at  this time with close outpatient follow up given and strict return precautions provided.       Clinical Impression     1. Non-recurrent acute suppurative otitis media of left ear without spontaneous rupture of tympanic membrane    2. Impacted cerumen of right ear    3. Acute nonintractable headache, unspecified headache type        Prescriptions     New Prescriptions    AZITHROMYCIN (ZITHROMAX Z-PAK) 250 MG TABLET    Take as directed.    HYDROCODONE-ACETAMINOPHEN 5-325 MG TABLET    Take 1-2 tablets by mouth every six (6) hours as needed for Moderate Pain (Pain Scale 4-6). Max Daily Amount: 8 tablets       Disposition and Follow-up   Disposition: Discharge [1]    No future appointments.    Follow up with:  Lanier Clam., MD  [address] [place]  [address]  [place] [address]  [phone]    In 1 week        Return precautions are specified on After Visit Summary.                 Pablo Ledger, MD  09/16/19 [phone]

## 2019-09-16 NOTE — ED Notes
Rounding: I introduced myself to the patient   I explained potential delays to the patient .  We discussed their needs.  Patient privacy was acknowledged.   There are no needs at this time.  ED course was explained to the patient .  Patient armband checked for accuracy.  Patient has correct armband on for positive identification.     Presents to ED with c/o bilateral ear pain - denies decreased hearing, denies vertigo or dizziness at this time

## 2019-09-21 MED ORDER — CLOPIDOGREL BISULFATE 75 MG PO TABS
ORAL_TABLET | 1 refills
Start: 2019-09-21 — End: ?

## 2019-09-22 ENCOUNTER — Telehealth: Payer: MEDICARE

## 2019-09-22 MED ORDER — CLOPIDOGREL BISULFATE 75 MG PO TABS
75 mg | ORAL_TABLET | Freq: Every day | ORAL | 0 refills | Status: AC
Start: 2019-09-22 — End: ?

## 2019-09-22 NOTE — Progress Notes
I received a refill request for Plavix. Patient was called and asked to make a 6 month fu appt with MRI with Dr. Darci Needle for May. No fu appt has been made. I will only refill the prescriptions for another month to hold her over until we see her in clinic. Patient reported she has been having insurance issues and will call schedulers to make her appt. Neuro clinic scheduling number was given. All questions answered.

## 2019-09-25 ENCOUNTER — Telehealth: Payer: MEDICARE

## 2019-09-25 NOTE — Telephone Encounter
Called patient to follow up on scheduling MR, [facility] ASA & PRU, and follow up consult with Dr. Darci Needle. Per patient she has recently changed medical group and has not been able to see her new PMD because she has not completed her COVID vaccination. She is scheduled for her second COVID vaccine on 10/21/2019.     Auth request faxed to new PMD Dr. Ardelle Lesches for MR, [employer], and follow up consult with Dr. Darci Needle. Will update patient as soon as we have more information regarding request.     Karen Porter

## 2019-10-11 ENCOUNTER — Ambulatory Visit: Payer: MEDICARE

## 2019-10-22 MED ORDER — CLOPIDOGREL BISULFATE 75 MG PO TABS
ORAL_TABLET | 0 refills
Start: 2019-10-22 — End: ?

## 2019-10-28 ENCOUNTER — Ambulatory Visit: Payer: MEDICARE

## 2019-10-30 ENCOUNTER — Ambulatory Visit: Payer: MEDICARE

## 2019-11-02 ENCOUNTER — Ambulatory Visit: Payer: Medicare Drug Benefit

## 2019-11-02 MED ORDER — LORAZEPAM 1 MG PO TABS
1 mg | ORAL_TABLET | Freq: Once | ORAL | 0 refills | Status: AC | PRN
Start: 2019-11-02 — End: ?

## 2019-11-04 DIAGNOSIS — I671 Cerebral aneurysm, nonruptured: Secondary | ICD-10-CM

## 2019-11-05 ENCOUNTER — Inpatient Hospital Stay: Payer: MEDICARE | Attending: Acute Care

## 2019-11-05 MED ADMIN — GADOBUTROL 1 MMOL/ML IV SOLN: 5.2 mL | INTRAVENOUS | @ 05:00:00 | Stop: 2019-11-05 | NDC 50419032511

## 2019-11-06 ENCOUNTER — Ambulatory Visit: Payer: MEDICARE

## 2019-11-06 DIAGNOSIS — I671 Cerebral aneurysm, nonruptured: Secondary | ICD-10-CM

## 2019-11-06 NOTE — Progress Notes
INTERVENTIONAL NEURORADIOLOGY  PROGRESS NOTE    PATIENT NAME: Karen Porter     DOB: Jun 19, 1955     MR#: [id number]     DATE: 11/06/2019     REFERRING PHYSICIAN: Lanier Clam., * MD    HISTORY OF PRESENT ILLNESS   Karen Porter is a 64 y.o. 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 She presents today for a clinic follow up.   ???  Since the procedure, the patient reports that her chronic headaches has resolved completely. She reports 1 month post procedure she had difficulty with coordination which self resolved. However, she continues to report that she is slow with her thought process and interpreting things. She remains on Plavix 75 mg and ASA 325 mg. She reports she had received two doses of Moderna vaccine and a few hours after her bilateral upper extremities had severe bruising. She followed up with her PCP and not treated. She reports it has self resolve now. Currently denies???weakness,???numbness, tingling, or slurred speech. She also denies any history of stroke, seizure or SAH. No family history???of???cerebral aneurysms.    MEDICATIONS:   Medications that the patient states to be currently taking   Medication Sig   ??? aspirin 325 mg EC tablet Take 325 mg by mouth daily.   ??? CLOPIDOGREL 75 mg tablet TAKE 1 TABLET BY MOUTH EVERY DAY   ??? irbesartan (AVAPRO) 150 mg tablet Take 150 mg by mouth daily.   ??? lamoTRIgine 25 mg tablet TAKE 1 TABLET BY MOUTH ONCE DAILY FOR 14 DAYS THEN TAKE 2 TABLETS ONCE DAILY   ??? LORazepam 1 mg tablet Take 1 tablet (1 mg total) by mouth Once as needed, may repeat 1 extra dose for Anxiety (Take 1 pill 30 min prior to the MRI, if feeling still anxious, may take second pill at the appointment.).   ??? metoprolol succinate 50 mg 24 hr tablet TAKE 1 TABLET BY MOUTH DAILY       ALLERGIES:   Allergies   Allergen Reactions   ??? Penicillins Rash       PHYSICAL EXAMINATION:    Vital Signs:BP 164/82  ~ Pulse 89  ~ Temp 36.4 ???C (97.5 ???F) (Forehead)  ~ Resp 16  ~ Ht 5' 3'' (1.6 m)  ~ Wt 122 lb (55.3 kg)  ~ SpO2 100%  ~ BMI 21.61 kg/m???     GEN:  Well developed, well nourished, in no acute distress.    SKIN:  PWD, no rashes noted.    HEENT:  Normocephalic.  Sclera anicteric.   EXT:   without clubbing, cyanosis or edema.  NEURO:    Mental Status:   General: Awake, alert and oriented to person, place and time   Concentration and attention span: Normal   Fund of knowledge: Adequate recent and remote recall   Language: Fluent and articulate without evidence of aphasia or dysarthria  Cranial Nerves:   II: PERRL, visual fields full to confrontation   III, IV, VI: Gaze conjugate, EOMi, no nystagmus   V: Sensation intact and symmetric to light touch V1-V3   VII: Symmetric facial motor function bilaterally   VIII: Intact to finger rub bilaterally   IX, X: Palate elevates symmetrically   XI: Normal shrug bilaterally   XII: Tongue protrudes midline  The corneal reflexes were not tested  Motor:   5/5 muscle strength against resistance throughout.    Normal bulk and tone.   No fasciculations, myotonia or other abnormal movements.  Sensation:   Light touch: Intact and symmetric in the bilateral upper and lower extremities.  DTRs:    2+ patellar bilaterally  Coordination:   Rapid alternating movements are normal, no dysdiadochokinesia  Gait:   Normal casual gait without ataxia.     Normal arm swing and step size.    Labs:                  Results for Karen, Porter (MRN [id number]) as of 11/06/2019 16:14   Ref. Range 11/02/2019 12:10   VerifyNow Aspirin Latest Units: ARU 507   VerifyNow PRU Latest Ref Range: See Comment PRU 15       IMAGING STUDIES:      11/06/2019 MRI brain w/wo contrast       ASSESSMENT/DISCUSSION: Karen Porter is a 64 y.o. 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     Today's imaging study demonstrates no infarcts in the downstream territories of the flow diversion stent or coils. There is no ischemic lesion to explain her symptoms. Given her ARU and PRU, she is well responding to DAPT.     I have discussed the findings with the patient and recommended for her to follow up in 2 months with a catheter transradial cerebral angiogram. We advise her to discontinue Plavix 75 mg and continues ASA 325 mg.     In regards to her bilateral upper extremity brusing, we likely believe it was thrombophlebitis due to the COVID-19 vaccine she received. It appears to be a massive thrombophlebitis of the upper extremities, which is resolved now, but is left with a left venous phlebi.     She may also do cosmetic injections such as fillers and Botox without complications. However, we would advise her to withhold from cosmetic surgery with anesthesia.     The patient agrees with this plan.  The risks, benefits, side effects, and alternatives of therapy were discussed with the patient, who verbalizes understanding. I spent 50% of my 60 minutes face-to-face with the patient discussing the examination, therapy, epidemiology, history, and natural course of the patient's condition. The patient was again educated about the signs and symptoms of stroke and brain hemorrhage and the need to call immediately 911 in that event. The patient verbalized understanding, and is to return to our clinic according to our discussion after the new imaging or sooner with any new neurological symptoms.      PLAN:  1. IR cerebral angiogram   2. Stop Plavix 75 mg  3. Continue Aspirin 325 mg  4. RTC after cerebral angiogram to discuss findings   5. Ok to proceed with cosmetic injection such as fillers or Botox    I, Davina Poke, MD, personally reviewed the history and examined the patient.  I agree with the findings and the assessment and plan of care as documented in this note.

## 2019-11-12 ENCOUNTER — Ambulatory Visit: Payer: PRIVATE HEALTH INSURANCE

## 2019-11-12 DIAGNOSIS — Z01812 Encounter for preprocedural laboratory examination: Secondary | ICD-10-CM

## 2019-12-02 ENCOUNTER — Ambulatory Visit: Payer: Medicare Drug Benefit

## 2019-12-14 ENCOUNTER — Institutional Professional Consult (permissible substitution): Payer: MEDICARE

## 2019-12-14 DIAGNOSIS — Z01812 Encounter for preprocedural laboratory examination: Secondary | ICD-10-CM

## 2019-12-15 LAB — COVID-19 PCR

## 2019-12-16 ENCOUNTER — Ambulatory Visit: Payer: Medicare Drug Benefit | Attending: Acute Care

## 2019-12-24 ENCOUNTER — Ambulatory Visit: Payer: Medicare Drug Benefit

## 2019-12-24 DIAGNOSIS — Z01812 Encounter for preprocedural laboratory examination: Secondary | ICD-10-CM

## 2019-12-25 ENCOUNTER — Telehealth: Payer: MEDICARE

## 2019-12-25 NOTE — Telephone Encounter
I called and left a message with phone operator Inetta Fermo, requesting pre-op clearance for patient. Inetta Fermo stated she would pass the message along to Dr. Karalee Height office and would call me back.

## 2019-12-26 ENCOUNTER — Ambulatory Visit: Payer: MEDICARE

## 2019-12-28 ENCOUNTER — Ambulatory Visit: Payer: MEDICARE | Attending: Acute Care

## 2019-12-29 ENCOUNTER — Ambulatory Visit: Payer: MEDICARE

## 2019-12-30 NOTE — Addendum Note
Addended by: Cheryll Dessert on: 12/30/2019 04:14 PM     Modules accepted: Orders

## 2019-12-31 ENCOUNTER — Ambulatory Visit: Payer: MEDICARE

## 2020-01-01 ENCOUNTER — Inpatient Hospital Stay
Admit: 2020-01-01 | Discharge: 2020-01-01 | Disposition: A | Discharge status: Home / Self Care | Payer: MEDICARE | Source: Ambulatory Visit | Attending: Acute Care

## 2020-01-01 DIAGNOSIS — I671 Cerebral aneurysm, nonruptured: Secondary | ICD-10-CM

## 2020-01-01 MED ADMIN — FENTANYL CITRATE (PF) 100 MCG/2ML IJ SOLN: INTRAVENOUS | @ 19:00:00 | Stop: 2020-01-01 | NDC 00409909422

## 2020-01-01 MED ADMIN — MIDAZOLAM HCL (PF) 2 MG/2ML IJ SOLN: INTRAVENOUS | @ 18:00:00 | Stop: 2020-01-01 | NDC 00409230517

## 2020-01-01 MED ADMIN — IOHEXOL 300 MG/ML IJ SOLN: 12000 mg | INTRA_ARTERIAL | @ 20:00:00 | Stop: 2020-01-01 | NDC 00407141361

## 2020-01-01 MED ADMIN — MIDAZOLAM HCL (PF) 2 MG/2ML IJ SOLN: INTRAVENOUS | @ 19:00:00 | Stop: 2020-01-01 | NDC 00409230517

## 2020-01-01 MED ADMIN — NITROGLYCERIN 100 MCG/ML 10 ML SYRINGE: @ 19:00:00 | Stop: 2020-01-01 | NDC 08252025010

## 2020-01-01 MED ADMIN — VERAPAMIL HCL 2.5 MG/ML IV SOLN: @ 19:00:00 | Stop: 2020-01-01 | NDC 00409114405

## 2020-01-01 MED ADMIN — HEPARIN SODIUM (PORCINE) 1000 UNIT/ML IJ SOLN: @ 19:00:00 | Stop: 2020-01-01 | NDC 00409272002

## 2020-01-01 NOTE — H&P
UPDATED H&P REQUIREMENT    For [facility] and [name]    WHAT IS THE STATUS OF THE PATIENT'S MOST CURRENT HISTORY AND PHYSICAL?   - The most current H&P is >24 hours and <30 days, and having examined the patient, I attest that there have been no changes. (This suffices as an update to the H&P).      REFER TO MEDICAL STAFF POLICIES REGARDING PRE-PROCEDURE HISTORY AND PHYSICAL EXAMINATION AND UPDATED H&P REQUIREMENTS BELOW:    [facility] and [name] Medical Staff Policy 200 - For Patients Undergoing Procedures Requiring Moderate or Deep Sedation, General Anesthesia or Regional Anesthesia    Contents of a History and Physical Examination (H&P):    The H&P shall consist of chief complaint, history of present illness, allergies and medications, relevant social and family history, past medical history, review of systems and physical examination, and assessment and plan appropriate to the patient's age.    For Patients Undergoing Procedures Requiring Moderate or Deep Sedation, General Anesthesia or Regional Anesthesia:    1. An H&P shall be performed within 24 hours prior to the procedure by a qualified member of the medical staff or designee with appropriate privileges, except as noted in item 2 below.    2. If a complete history and physical was performed within thirty (30) calendar days prior to the patient's admission to the Medical Center for elective surgery, a member of the medical staff assumes the responsibility for the accuracy of the clinical information and will need to document in the medical record within twenty-four (24) hours of admission and prior to surgery or major invasive procedure, that they either attest that the history and physical has been reviewed and accepted, or document an update of the original history and physical relevant to the patient's current  clinical status.    3. Providing an H&P for patients undergoing surgery under local anesthesia is at the discretion of the Attending Physician.     4. When a procedure is performed by a dentist, podiatrist or other practitioner who is not privileged to perform an H&P, the anesthesiologist's assessment immediately prior to the procedure will constitute the 24 hour re-assessment.The dentist, podiatrist or other practitioner who is not privileged to perform an H&P will document the history and physical relevant to the procedure.    5. If the H&P and the written informed consent for the surgery or procedure are not recorded in the patient's medical record prior to surgery, the operation shall not be performed unless the attending physician states in writing that such a delay could lead to an adverse event or irreversible damage to the patient.    6. The above requirements shall not preclude the rendering of emergency medical or surgical care to a patient in dire circumstances.

## 2020-01-01 NOTE — Op Note
Interventional Neuroradiology  Brief Operative/Procedure Note    Patient: Karen Porter    Date of Operation(s)/Procedure(s): 01/01/2020    Pre-op Diagnosis: Cerebral aneurysm, s/p treatment  Post-op Diagnosiss: Same     Attending: Dr. Darci Needle, MD Fellow:  Dayton Martes, MD   Anesthesia Type:   Moderate sedation    Procedure(s) performed:   1. Cerebral angiogram via Right radial artery.    Findings:   1. Complete treatment of the Acomm aneurysm (with coil embolization). No residual filling. Patent flow diverter in the left A1-A2 artery.      Contrast: 40 ml  Estimated Blood Loss: Minimal    Complications: None; patient tolerated the operation(s)/procedure(s) well.    Dayton Martes, MD  Interventional Neuroradiology Fellow, PGY-7    Date: 01/01/2020  Time: 12:46 PM

## 2020-01-02 NOTE — Nursing Note
Discharge Team Note    Needs for Discharge:    Escort [x]     Interventions:    Escorted patient out of the unit [x]     Disposition:    Patient was escorted to Emergency Department entrance [x]     Patient was discharged home accompanied by:  Home with mother.

## 2020-01-14 ENCOUNTER — Non-Acute Institutional Stay: Payer: MEDICARE

## 2021-01-16 ENCOUNTER — Non-Acute Institutional Stay: Payer: MEDICARE

## 2021-01-16 DIAGNOSIS — I671 Cerebral aneurysm, nonruptured: Secondary | ICD-10-CM

## 2021-03-13 ENCOUNTER — Non-Acute Institutional Stay: Payer: MEDICARE

## 2021-03-22 ENCOUNTER — Non-Acute Institutional Stay: Payer: MEDICARE

## 2021-05-15 ENCOUNTER — Non-Acute Institutional Stay: Payer: MEDICARE

## 2021-05-22 ENCOUNTER — Non-Acute Institutional Stay: Payer: MEDICARE

## 2021-05-22 DIAGNOSIS — R079 Chest pain, unspecified: Secondary | ICD-10-CM

## 2021-05-23 ENCOUNTER — Inpatient Hospital Stay
Admit: 2021-05-23 | Discharge: 2021-05-23 | Disposition: A | Discharge status: Home / Self Care | Payer: MEDICARE | Source: Home / Self Care | Attending: Student in an Organized Health Care Education/Training Program

## 2021-05-23 LAB — Glucose, Whole Blood: GLUCOSE, WHOLE BLOOD: 86 mg/dL (ref 65–99)

## 2021-05-23 LAB — D-Dimer: D-DIMER STAGO: 0.35 ug{FEU}/mL (ref <0.60)

## 2021-05-23 LAB — CREATININE: CREATININE: 0.67 mg/dL (ref 0.60–1.30)

## 2021-05-23 LAB — Extra Gold Top

## 2021-05-23 LAB — CBC: MEAN CORPUSCULAR VOLUME: 80.5 fL (ref 79.3–98.6)

## 2021-05-23 LAB — Extra Lavender Top

## 2021-05-23 LAB — Extra Red Top (Plastic)

## 2021-05-23 LAB — Extra Light Blue Top

## 2021-05-23 LAB — Extra Light Green Top

## 2021-05-23 LAB — Troponin I: TROPONIN I: <0.04 ng/mL (ref <0.04)

## 2021-05-23 LAB — Electrolyte Panel: CHLORIDE: 103 mmol/L (ref 96–106)

## 2021-05-23 MED ADMIN — POTASSIUM CHLORIDE 20 MEQ/15ML (10%) PO SOLN: 40 meq | ORAL | @ 07:00:00 | Stop: 2021-05-23 | NDC 60687062845

## 2021-05-23 MED ADMIN — ACETAMINOPHEN 325 MG PO TABS: 975 mg | ORAL | @ 06:00:00 | Stop: 2021-05-23 | NDC 00904677361

## 2021-05-23 MED ADMIN — KETOROLAC TROMETHAMINE 30 MG/ML IJ SOLN: 15 mg | INTRAVENOUS | @ 07:00:00 | Stop: 2021-05-23 | NDC 72266011801

## 2021-05-23 NOTE — Discharge Instructions
Emergency Department Discharge Instructions      Summary of your visit  You have been evaluated in the [facility] Emergency Department today for chest pain.  Your evaluation included a physical exam, chest X-ray, and EKG which were reassuring today.  We have observed you in the ER and have determined that you are stable for discharge at this time.  However, we recommend that you follow up with your primary care physician or your cardiologist as soon as possible for further testing as an outpatient.    Follow-Up  Please follow up with your primary care physician within three days after being discharged from the ER - you can call to schedule an appointment.  You can find a primary care physician at [facility] by calling [phone].    You can also call [phone] to make an appointment with a cardiologist at [facility].  If you do not have a Primary Care Physician, please call your insurance company or you may call [phone] to establish care with a [facility] physician.  If you are uninsured, please call 2-1-1 to find a free or low-cost clinic in your area. 2-1-1 [place] is the central source for providing information and referrals for all health and human services in [place]. Our 2-1-1 phone line is open 24 hours, 7 days a week, with trained Community Resource Advisors prepared to offer help with any situation, any time. Our community services go far beyond phone referrals - explore our website to learn more. If you are calling from outside [place] or cannot directly dial 2-1-1, you can call [phone].      Return to the Emergency Department if you experience:  Worsening or uncontrolled pain  Persistent nausea and vomiting  Shortness of breath  Dizziness or lightheadedness  Fainting  Any other concerning symptoms     Thank you for choosing [facility] for your care. It was a pleasure taking part in your care today, and we wish you the best!

## 2021-05-23 NOTE — ED Provider Notes
Karen Porter [facility]  Emergency Department Service Report    Karen Porter, a 65 y.o. female, presents with Chest Pain (Reports chest pressure with radiation to Larm onset 1200 today, with nausea, dizziness )      Triage   Arrived at 4:00 PM  Arrived by Wheelchair [4]    ED Triage Vitals   Temp Temp Source BP Heart Rate Resp SpO2 O2 Device Pain Score Weight   05/22/21 1653 05/22/21 1843 05/22/21 1653 05/22/21 1653 05/22/21 1653 05/22/21 1653 05/22/21 1653 05/22/21 1605 --   36.4 ?C (97.5 ?F) Temporal (!) 181/95 81 18 100 % None (Room air) Five        Chief Complaint   Patient presents with   ? Chest Pain     Reports chest pressure with radiation to Larm onset 1200 today, with nausea, dizziness         Comprehensive Exam Initiated  Contact Date: 05/22/21  Contact Time: 2211      History   65 year old female with history of hypertension and recent COVID infection presents with 12 hours of acute onset, sharp left-sided chest and rib pain that is constant, and worse with deep breathing.  She feels slightly short of breath.  She denies fevers or cough.  She denies exertional dyspnea or chest pain.  She has not had similar symptoms in the past.  Overall her COVID symptoms had improved prior to today.  She has not taken any medications for her symptoms.    Pertinent family history: None  Pertinent social history: None   Records reviewed: Triage note and vitals    ROS:    General:  No fevers or chills.   Cardiac:  See HPI  Respiratory:  No cough, no shortness of breath.  Gastrointestinal:  No abdominal pain.  Genitourinary:  No dysuria.  Neurologic:  No focal weakness or difficulties with gait.  Psychiatric:  No changes in mood.  Skin:  No rashes.  Remainder of ROS reviewed and negative except as noted in HPI    Physical Exam:  BP 166/87  ~ Pulse 80  ~ Temp 36 ?C (96.8 ?F) (Temporal)  ~ Resp 18  ~ SpO2 99%    Vital signs reviewed  General:  Well-appearing in no acute distress   HEENT:  Normocephalic, atraumatic.  Eyes:  Extraocular eye movements grossly intact, no scleral icterus.  Cardiovascular:  Strong radial pulse, regular rhythm.  Regular rate and rhythm, no murmurs.  Respiratory:  Normal and nonlabored breathing.  Lungs clear to auscultation bilaterally.  Chest wall:  Left-sided chest pain is reproducible on palpation.  Abdomen:  Nondistended.   Musculoskeletal:  No deformities or edema.  Neurological:  Alert and oriented to person, place, time, and event.  Moving all 4 extremities with no apparent focal neurologic deficits.  Normal gait.  Psychiatric:  Normal mood and affect, interacting appropriately with staff.  Skin:  No apparent rashes or erythema.    MDM:  This patient presents with chest pain.  Will obtain an EKG and troponin to rule out ACS/mi.  I have a low suspicion for unstable angina.  Will give Toradol for symptom control.  Will obtain a chest x-ray to rule out pneumonia or pneumothorax.  Will obtain basic labs to evaluate for anemia or electrolyte abnormality.  The patient's heart score is 3, given my suspicion that her chest pain is musculoskeletal or due to pleurisy, the patient may be discharged if her workup is unremarkable.  I have a  low suspicion for myocarditis or pericarditis based on her symptoms and exam.  This patient's presentation is not consistent with aortic dissection, high risk pulmonary embolism, or other emergent etiology.    No prescriptions indicated on discharge.  Advised the patient to follow-up with her primary care doctor to discuss the need for stress testing.    EKG shows sinus rhythm at a rate of 68, normal axis, normal intervals, no ST-T changes.    Progress Notes  ED Course as of 05/23/21 0140   Mon May 22, 2021   2228 Potassium(!): 3.3  Mammie Lorenzo [AY]      ED Course User Index  [AY] Dayton Martes., MD        Additional past medical history:    Past Medical History:   Diagnosis Date   ? Amblyopia of right eye    ? Anxiety    ? 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 Depression    ? Hyperlipidemia    ? Hypertension     pt states she stopped taking bp medication 11/25 because it was under conrol with diet   ? Insomnia    ? Retinal detachment, right         Past Surgical History:   Procedure Laterality Date   ? BUNIONECTOMY     ? CATARACT EXTRACTION  1986    right eye   ? COSMETIC SURGERY     ? OVARIAN CYST REMOVAL          Laboratory Results     Labs Reviewed   CBC - Abnormal; Notable for the following components:       Result Value    Mean Corpuscular Hemoglobin 26.1 (*)     Platelet Count, Auto 399 (*)     Mean Platelet Volume 8.2 (*)     All other components within normal limits   ELECTROLYTE PANEL - Abnormal; Notable for the following components:    Potassium 3.3 (*)     All other components within normal limits   GLUCOSE - Normal   TROPONIN I - Normal   D-DIMER - Normal    Narrative:     Note: Quantitative method to detect D-Dimer.    Cut-off value to rule out DVT/PE in patients with low/moderate pretest probability: <0.50 ug/mL FEU   CREATININE,WHOLE BLOOD   EXTRA LAVENDER TOP        Imaging Results     XR chest pa+lat (2 views)   Final Result by Dennie Fetters M. (12/12 2246)   IMPRESSION:     Clear lungs. No consolidations.   No pleural effusion or pneumothorax.   Normal cardiac silhouette size.   Rim calcified breast implants.      Signed by: Dennie Fetters   05/22/2021 10:46 PM          Any ED procedures performed are documented on separate ED procedure notes.    Clinical Impression     1. Chest pain, unspecified type        Disposition and Follow-up   Disposition: Discharge [1]        Future Appointments   Date Time Provider Department Center   06/17/2021  9:30 AM SMO MR01 NB 1.5T (HOSP) MRI [facility] Radiology   06/20/2021  2:00 PM Davina Poke, MD [facility] MP1 INTERVENTION       Follow up with:  Lanier Clam., MD  [address] [facility] [address]  [address]  [place]  [id number]  [phone]    In 3 days        Return precautions are specified on After Visit Summary.    Discharge Medication List as of 05/22/2021 11:25 PM          Orders Placed This Encounter   ? XR chest pa+lat (2 views)   ? Rainbow Draw (ED Adult Blood Draw: Nurse Protocol)   ? Extra [employer] (Plastic)   ? Extra Light Blue Top   ? Extra Burna Mortimer Top   ? Extra Burna Mortimer Top   ? Extra Burna Mortimer Top   ? Extra Lavender Top   ? Extra Lavender Top   ? Extra Gold Top   ? CBC without differential   ? Electrolyte Panel   ? Creatinine,whole blood   ? Glucose   ? Troponin   ? D-dimer   ? ECG, 12 lead (ED Chest Pain: Nurse Protocol)   ? ED 12 LEAD ECG    ? ECG, 12 lead   ? ED 12 LEAD ECG    ? ED 12 LEAD ECG    ? ECG, 12 lead   ? acetaminophen tab 975 mg   ? ketorolac 30 mg/mL inj 15 mg   ? potassium chloride 20 mEq/15 mL soln 40 mEq       Resident Signature     Hulan Amato, MD  PGY4 Emergency Medicine  05/23/2021 1:40 AM           Bonnita Hollow., MD  Resident  05/23/21 [phone]

## 2021-05-23 NOTE — ED Notes
Pt sitting in lobby, awake and alert, speaking in full sentences, breathing even and non labored,skin color normal for race, no change from baseline assessment, no signs of acute distress, pt updated on plan to see pt, will continue to monitor

## 2021-05-26 ENCOUNTER — Non-Acute Institutional Stay: Payer: MEDICARE

## 2021-06-17 ENCOUNTER — Inpatient Hospital Stay: Payer: MEDICARE

## 2021-06-17 DIAGNOSIS — I671 Cerebral aneurysm, nonruptured: Secondary | ICD-10-CM

## 2021-06-17 MED ADMIN — GADOBUTROL 1 MMOL/ML IV SOLN: 6 mL | INTRAVENOUS | @ 18:00:00 | Stop: 2021-06-17 | NDC 50419032511

## 2021-06-20 ENCOUNTER — Telehealth: Payer: MEDICARE

## 2021-06-21 NOTE — Patient Instructions
Karen Porter very pleasant lady with acomm aneurysm s/p flowdiverter and coiling in Nov 2020. She had follow up dx angiogram and MRAs, which did not show any residual. She had another follow up MRA on June 17, 2021. I personally reviewed the MRA and concur with the report. The flowdiverter seems patent without any significant parent artery stenosis. There is no flow signal in the aneurysm. Both ACAs and the small branch encased at the base of the aneurysm remain patent.     Given her aneurysm showed unusual configuration and encased a small branch, it was highly suggestive of underlying vascular fragility. Although flowdiversion treatment is durable, I recommend to continue follow up imaging. She understood the finding, assessment, and plan.     Plan  Follow up 3T Brain MRA in 2 years
